# Patient Record
Sex: Male | Born: 1955 | Race: White | Hispanic: No | Marital: Married | Smoking: Former smoker
Health system: Southern US, Community
[De-identification: ages and names within clinical notes are randomized; demographics above are authoritative.]

## PROBLEM LIST (undated history)

## (undated) DIAGNOSIS — H18609 Keratoconus, unspecified, unspecified eye: Secondary | ICD-10-CM

## (undated) DIAGNOSIS — R079 Chest pain, unspecified: Secondary | ICD-10-CM

## (undated) DIAGNOSIS — K219 Gastro-esophageal reflux disease without esophagitis: Secondary | ICD-10-CM

## (undated) DIAGNOSIS — N433 Hydrocele, unspecified: Secondary | ICD-10-CM

## (undated) DIAGNOSIS — E119 Type 2 diabetes mellitus without complications: Secondary | ICD-10-CM

## (undated) DIAGNOSIS — J189 Pneumonia, unspecified organism: Secondary | ICD-10-CM

## (undated) DIAGNOSIS — R000 Tachycardia, unspecified: Secondary | ICD-10-CM

## (undated) DIAGNOSIS — I10 Essential (primary) hypertension: Secondary | ICD-10-CM

## (undated) DIAGNOSIS — G4733 Obstructive sleep apnea (adult) (pediatric): Secondary | ICD-10-CM

## (undated) DIAGNOSIS — R0789 Other chest pain: Secondary | ICD-10-CM

## (undated) DIAGNOSIS — E7800 Pure hypercholesterolemia, unspecified: Secondary | ICD-10-CM

## (undated) HISTORY — DX: Tachycardia, unspecified: R00.0

## (undated) HISTORY — DX: Essential (primary) hypertension: I10

## (undated) HISTORY — PX: KNEE ARTHROSCOPY W/ ACL RECONSTRUCTION: SHX1858

## (undated) HISTORY — DX: Other chest pain: R07.89

## (undated) HISTORY — DX: Obstructive sleep apnea (adult) (pediatric): G47.33

## (undated) HISTORY — DX: Chest pain, unspecified: R07.9

## (undated) HISTORY — PX: KNEE ARTHROSCOPY: SUR90

## (undated) HISTORY — DX: Keratoconus, unspecified, unspecified eye: H18.609

---

## 2003-01-16 ENCOUNTER — Encounter: Admission: RE | Admit: 2003-01-16 | Discharge: 2003-04-16 | Payer: Self-pay | Admitting: Family Medicine

## 2003-04-16 ENCOUNTER — Encounter: Admission: RE | Admit: 2003-04-16 | Discharge: 2003-07-15 | Payer: Self-pay | Admitting: Family Medicine

## 2011-10-12 ENCOUNTER — Other Ambulatory Visit: Payer: Self-pay | Admitting: Urology

## 2011-11-29 ENCOUNTER — Encounter (HOSPITAL_BASED_OUTPATIENT_CLINIC_OR_DEPARTMENT_OTHER): Payer: Self-pay | Admitting: *Deleted

## 2011-11-29 NOTE — Progress Notes (Signed)
NPO AFTER MN. PT ARRIVES AT 0615. NEEDS ISTAT AND EKG.

## 2011-12-02 NOTE — H&P (Signed)
History of Present Illness      [name] is a 56 year old male patient of Dr. Tenny Craw who was seen in office consultation for further evaluation of an enlarging hydrocele. The patient reports that he has had this for several years and has been seen in the past by someone but cannot recall by whom. He indicates that he was told it was a benign lesion and that at that time nothing was required. It has increased in size and now is becoming uncomfortable especially when he sits in certain positions. It's not associated with any voiding symptoms. He denies any discomfort when he standing. There are no other signs and symptoms or modifying factors. It's of mild to moderate severity.   Past Medical History Problems  1. History of  Arthritis V13.4 2. History of  Diabetes Mellitus 250.00 3. 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 History of  Knee Surgery Bilateral 2. History of  Knee Surgery 3. History of  Knee Surgery 4. History of  Knee Surgery  Current Meds 1. Aspirin 81 MG Oral Tablet; Therapy: (Recorded:18Sep2012) to 2. Benazepril HCl 20 MG Oral Tablet; Therapy: (Recorded:18Sep2012) to 3. Glimepiride 2 MG Oral Tablet; Therapy: (Recorded:18Sep2012) to 4. Kombiglyze XR 2.03-999 MG Oral Tablet Extended Release 24 Hour; Therapy:  (Recorded:18Sep2012) to  Allergies Medication  1. No Known Drug Allergies  Family History Problems  1. Family history of  Adopted 2. Family history of  Family Health Status Number Of Children 3. Family history of  FH Unobtainable - Patient Adopted  Social History Problems    Alcohol Use   Caffeine Use   Former Smoker V15.82   Marital History - Currently Married   Occupation:  Review of Systems Genitourinary, constitutional, skin, eye, otolaryngeal, hematologic/lymphatic, cardiovascular, pulmonary, endocrine, musculoskeletal, gastrointestinal, neurological and psychiatric system(s) were reviewed and pertinent findings if present are  noted.  Genitourinary: urinary frequency, nocturia, difficulty starting the urinary stream, weak urinary stream, urinary stream starts and stops and erectile dysfunction.  Constitutional: feeling tired (fatigue).  Eyes: blurred vision.    Vitals Vital Signs BMI Calculated: 33.32 BSA Calculated: 1.98 Height: 5 ft 5 in Weight: 200 lb  Blood Pressure: 144 / 82 Temperature: 98.8 F Heart Rate: 102  Physical Exam Constitutional: Well nourished and well developed . No acute distress.  ENT:. The ears and nose are normal in appearance.  Neck: The appearance of the neck is normal and no neck mass is present.  Pulmonary: No respiratory distress and normal respiratory rhythm and effort.  Cardiovascular: Heart rate and rhythm are normal . No peripheral edema.  Abdomen: The abdomen is soft and nontender. No masses are palpated. No CVA tenderness. No hernias are palpable. No hepatosplenomegaly noted.  Genitourinary: Examination of the penis demonstrates no discharge, no masses, no lesions and a normal meatus. The scrotum is without lesions. The right epididymis is palpably normal and non-tender. The left epididymis is found to have a 5-6 cm spermatocele, but non-tender. The right testis is non-tender and without masses. The left testis is non-tender and without masses.  Lymphatics: The femoral and inguinal nodes are not enlarged or tender.  Skin: Normal skin turgor, no visible rash and no visible skin lesions.  Neuro/Psych:. Mood and affect are appropriate.    Results/Data Urine COLOR: YELLOW  Reference Range YELLOW APPEARANCE: CLEAR  Reference Range CLEAR SPECIFIC GRAVITY: 1.020  Reference Range 1.005-1.030 pH: 5.5  Reference Range 5.0-8.0 GLUCOSE: NEG mg/dL Reference Range NEG BILIRUBIN: NEG  Reference Range  NEG KETONE: NEG mg/dL Reference Range NEG BLOOD: NEG  Reference Range NEG PROTEIN: NEG mg/dL Reference Range NEG UROBILINOGEN: 0.2 mg/dL Reference Range 2.1-3.0 NITRITE: NEG  Reference  Range NEG LEUKOCYTE ESTERASE: NEG  Reference Range NEG  Old records or history reviewed: notes from Dr. Charlott Rakes office as above.  The following clinical lab reports were reviewed:  His urinalysis at Dr. Charlott Rakes office was negative.    Assessment Assessed  1. Spermatocele Left 608.1   He has a cystic lesion that is located above his left testicle consistent with either a loculated hydrocele or very possibly a spermatocele. We discussed the treatment options. Observation is one option although this has been undertaken and he would like to proceed with more definitive treatment. We discussed aspiration however the likelihood of its recurrence is extremely high and therefore the definitive therapy would be surgical excision. I discussed the procedure with him in detail including its risks and complications. We also discussed his anticipated postoperative course. He understands and has elected to proceed.   Plan Health Maintenance (V70.0)  1. UA With REFLEX  Done: 18Sep2012 01:04PM Spermatocele (608.1)  2. Follow-up Schedule Surgery Office  Follow-up  Done: 18Sep2012   He is scheduled for an outpatient scrotal exploration with left spermatocelectomy.

## 2011-12-03 ENCOUNTER — Encounter (HOSPITAL_BASED_OUTPATIENT_CLINIC_OR_DEPARTMENT_OTHER): Payer: Self-pay | Admitting: *Deleted

## 2011-12-03 ENCOUNTER — Encounter (HOSPITAL_BASED_OUTPATIENT_CLINIC_OR_DEPARTMENT_OTHER): Payer: Self-pay | Admitting: Anesthesiology

## 2011-12-03 ENCOUNTER — Other Ambulatory Visit: Payer: Self-pay

## 2011-12-03 ENCOUNTER — Ambulatory Visit (HOSPITAL_BASED_OUTPATIENT_CLINIC_OR_DEPARTMENT_OTHER)
Admission: RE | Admit: 2011-12-03 | Discharge: 2011-12-03 | Disposition: A | Discharge status: Home / Self Care | Payer: Commercial Managed Care - PPO | Source: Ambulatory Visit | Attending: Urology | Admitting: Urology

## 2011-12-03 ENCOUNTER — Encounter (HOSPITAL_BASED_OUTPATIENT_CLINIC_OR_DEPARTMENT_OTHER)
Admission: RE | Disposition: A | Discharge status: Home / Self Care | Payer: Self-pay | Source: Ambulatory Visit | Attending: Urology

## 2011-12-03 ENCOUNTER — Ambulatory Visit (HOSPITAL_BASED_OUTPATIENT_CLINIC_OR_DEPARTMENT_OTHER): Payer: Commercial Managed Care - PPO | Admitting: Anesthesiology

## 2011-12-03 DIAGNOSIS — L723 Sebaceous cyst: Secondary | ICD-10-CM | POA: Insufficient documentation

## 2011-12-03 DIAGNOSIS — Z7982 Long term (current) use of aspirin: Secondary | ICD-10-CM | POA: Insufficient documentation

## 2011-12-03 DIAGNOSIS — Z79899 Other long term (current) drug therapy: Secondary | ICD-10-CM | POA: Insufficient documentation

## 2011-12-03 DIAGNOSIS — E119 Type 2 diabetes mellitus without complications: Secondary | ICD-10-CM | POA: Insufficient documentation

## 2011-12-03 DIAGNOSIS — N503 Cyst of epididymis: Secondary | ICD-10-CM

## 2011-12-03 HISTORY — PX: HYDROCELE EXCISION: SHX482

## 2011-12-03 HISTORY — DX: Hydrocele, unspecified: N43.3

## 2011-12-03 LAB — POCT I-STAT 4, (NA,K, GLUC, HGB,HCT)
Glucose, Bld: 180 mg/dL — ABNORMAL HIGH (ref 70–99)
HCT: 40 % (ref 39.0–52.0)
Hemoglobin: 13.6 g/dL (ref 13.0–17.0)
Potassium: 4.2 mEq/L (ref 3.5–5.1)
Sodium: 141 mEq/L (ref 135–145)

## 2011-12-03 LAB — GLUCOSE, CAPILLARY: Glucose-Capillary: 201 mg/dL — ABNORMAL HIGH (ref 70–99)

## 2011-12-03 SURGERY — HYDROCELECTOMY
Anesthesia: General | Site: Scrotum | Laterality: Left | Wound class: Clean Contaminated

## 2011-12-03 MED ORDER — HYDROCODONE-ACETAMINOPHEN 5-325 MG PO TABS
2.0000 | ORAL_TABLET | Freq: Four times a day (QID) | ORAL | Status: AC | PRN
  Administered 2011-12-03: 2 via ORAL

## 2011-12-03 MED ORDER — ONDANSETRON HCL 4 MG/2ML IJ SOLN
INTRAMUSCULAR | Status: DC | PRN
  Administered 2011-12-03: 4 mg via INTRAVENOUS

## 2011-12-03 MED ORDER — PROPOFOL 10 MG/ML IV EMUL
INTRAVENOUS | Status: DC | PRN
  Administered 2011-12-03: 200 mg via INTRAVENOUS

## 2011-12-03 MED ORDER — FENTANYL CITRATE 0.05 MG/ML IJ SOLN
INTRAMUSCULAR | Status: DC | PRN
  Administered 2011-12-03: 25 ug via INTRAVENOUS
  Administered 2011-12-03: 50 ug via INTRAVENOUS
  Administered 2011-12-03 (×2): 25 ug via INTRAVENOUS
  Administered 2011-12-03: 50 ug via INTRAVENOUS
  Administered 2011-12-03: 25 ug via INTRAVENOUS

## 2011-12-03 MED ORDER — LACTATED RINGERS IV SOLN
INTRAVENOUS | Status: DC
  Administered 2011-12-03: 11:00:00 via INTRAVENOUS

## 2011-12-03 MED ORDER — MEPERIDINE HCL 25 MG/ML IJ SOLN: 6.2500 mg | INTRAMUSCULAR | Status: DC | PRN

## 2011-12-03 MED ORDER — BUPIVACAINE HCL (PF) 0.25 % IJ SOLN
INTRAMUSCULAR | Status: DC | PRN
  Administered 2011-12-03: 10 mL

## 2011-12-03 MED ORDER — LACTATED RINGERS IV SOLN
INTRAVENOUS | Status: DC
  Administered 2011-12-03: 07:00:00 via INTRAVENOUS

## 2011-12-03 MED ORDER — PROMETHAZINE HCL 25 MG/ML IJ SOLN
6.2500 mg | INTRAMUSCULAR | Status: DC | PRN
  Administered 2011-12-03: 6.25 mg via INTRAVENOUS

## 2011-12-03 MED ORDER — LIDOCAINE HCL (CARDIAC) 20 MG/ML IV SOLN
INTRAVENOUS | Status: DC | PRN
  Administered 2011-12-03: 80 mg via INTRAVENOUS

## 2011-12-03 MED ORDER — FENTANYL CITRATE 0.05 MG/ML IJ SOLN
25.0000 ug | INTRAMUSCULAR | Status: DC | PRN
  Administered 2011-12-03 (×4): 25 ug via INTRAVENOUS

## 2011-12-03 MED ORDER — HYDROCODONE-ACETAMINOPHEN 10-300 MG PO TABS: 1.0000 | ORAL_TABLET | Freq: Four times a day (QID) | ORAL | Status: DC | PRN

## 2011-12-03 MED ORDER — CEFAZOLIN SODIUM 1-5 GM-% IV SOLN
1.0000 g | INTRAVENOUS | Status: AC
  Administered 2011-12-03: 2 g via INTRAVENOUS

## 2011-12-03 MED ORDER — 0.9 % SODIUM CHLORIDE (POUR BTL) OPTIME
TOPICAL | Status: DC | PRN
  Administered 2011-12-03: 500 mL

## 2011-12-03 SURGICAL SUPPLY — 44 items
BANDAGE GAUZE ELAST BULKY 4 IN (GAUZE/BANDAGES/DRESSINGS) ×2 IMPLANT
BLADE SURG 15 STRL LF DISP TIS (BLADE) ×1 IMPLANT
BLADE SURG 15 STRL SS (BLADE) ×1
BLADE SURG ROTATE 9660 (MISCELLANEOUS) ×4 IMPLANT
CANISTER SUCTION 1200CC (MISCELLANEOUS) IMPLANT
CANISTER SUCTION 2500CC (MISCELLANEOUS) ×2 IMPLANT
CLEANER CAUTERY TIP 5X5 PAD (MISCELLANEOUS) ×1 IMPLANT
CLOTH BEACON ORANGE TIMEOUT ST (SAFETY) ×2 IMPLANT
COVER MAYO STAND STRL (DRAPES) ×2 IMPLANT
COVER TABLE BACK 60X90 (DRAPES) ×2 IMPLANT
DISSECTOR ROUND CHERRY 3/8 STR (MISCELLANEOUS) ×2 IMPLANT
DRAPE PED LAPAROTOMY (DRAPES) ×2 IMPLANT
ELECT REM PT RETURN 9FT ADLT (ELECTROSURGICAL) ×2
ELECTRODE REM PT RTRN 9FT ADLT (ELECTROSURGICAL) ×1 IMPLANT
GAUZE KERLIX 2  STERILE LF (GAUZE/BANDAGES/DRESSINGS) ×2 IMPLANT
GLOVE BIO SURGEON STRL SZ8 (GLOVE) ×2 IMPLANT
GLOVE BIOGEL M 6.5 STRL (GLOVE) ×2 IMPLANT
GLOVE BIOGEL M 7.0 STRL (GLOVE) ×2 IMPLANT
GLOVE ECLIPSE 6.0 STRL STRAW (GLOVE) ×2 IMPLANT
GOWN PREVENTION PLUS XLARGE (GOWN DISPOSABLE) ×2 IMPLANT
GOWN STRL NON-REIN LRG LVL3 (GOWN DISPOSABLE) ×2 IMPLANT
GOWN W/COTTON TOWEL STD LRG (GOWNS) ×2 IMPLANT
GOWN XL W/COTTON TOWEL STD (GOWNS) ×2 IMPLANT
IV NS IRRIG 3000ML ARTHROMATIC (IV SOLUTION) IMPLANT
NEEDLE HYPO 25X1 1.5 SAFETY (NEEDLE) ×2 IMPLANT
NS IRRIG 500ML POUR BTL (IV SOLUTION) ×2 IMPLANT
PACK BASIN DAY SURGERY FS (CUSTOM PROCEDURE TRAY) ×2 IMPLANT
PAD CLEANER CAUTERY TIP 5X5 (MISCELLANEOUS) ×1
PENCIL BUTTON HOLSTER BLD 10FT (ELECTRODE) ×2 IMPLANT
SUPPORT SCROTAL LG STRP (MISCELLANEOUS) IMPLANT
SUPPORT SCROTAL LRG NO STRP (SOFTGOODS) ×2 IMPLANT
SUT CHROMIC 3 0 SH 27 (SUTURE) ×4 IMPLANT
SUT SILK 4 0 TIES 17X18 (SUTURE) IMPLANT
SUT VIC AB 4-0 RB1 27 (SUTURE)
SUT VIC AB 4-0 RB1 27X BRD (SUTURE) IMPLANT
SUT VICRYL 6 0 RB 1 (SUTURE) IMPLANT
SYR CONTROL 10ML LL (SYRINGE) ×2 IMPLANT
TAPE HYPAFIX 4 X10 (GAUZE/BANDAGES/DRESSINGS) ×2 IMPLANT
TOWEL OR 17X24 6PK STRL BLUE (TOWEL DISPOSABLE) ×2 IMPLANT
TRAY DSU PREP LF (CUSTOM PROCEDURE TRAY) ×2 IMPLANT
TUBE CONNECTING 12X1/4 (SUCTIONS) ×2 IMPLANT
WATER STERILE IRR 3000ML UROMA (IV SOLUTION) IMPLANT
WATER STERILE IRR 500ML POUR (IV SOLUTION) ×2 IMPLANT
YANKAUER SUCT BULB TIP NO VENT (SUCTIONS) ×2 IMPLANT

## 2011-12-03 NOTE — Anesthesia Procedure Notes (Signed)
Procedure Name: LMA Insertion Date/Time: 12/03/2011 7:36 AM Performed by: Lorrin Jackson Pre-anesthesia Checklist: Patient identified, Emergency Drugs available, Suction available and Patient being monitored Patient Re-evaluated:Patient Re-evaluated prior to inductionOxygen Delivery Method: Circle System Utilized Preoxygenation: Pre-oxygenation with 100% oxygen Intubation Type: IV induction Ventilation: Mask ventilation without difficulty LMA: LMA inserted LMA Size: 4.0 Number of attempts: 1 Airway Equipment and Method: bite block Placement Confirmation: positive ETCO2 Tube secured with: Tape Dental Injury: Teeth and Oropharynx as per pre-operative assessment

## 2011-12-03 NOTE — Op Note (Signed)
Pre-Operative Dx: Left hydrocele  POST-OPERATIVE DIAGNOSIS: Large left epididymal cyst  PROCEDURE:  Procedure(s): Left  epididymal cyst excision   SURGEON:  Garnett Farm  INDICATION: Brian Rollins is a 56 year old male with an enlarging left hydrocele. It is becoming uncomfortable in certain positions and we have discussed the treatment options. He has elected to proceed with surgical correction.  ANESTHESIA:  General  EBL:  Minimal  DRAINS: None  LOCAL MEDICATIONS USED:   9.5 cc of quarter percent plain Marcaine   SPECIMEN:  None  DISPOSITION OF SPECIMEN:  N/A  Description of procedure: After informed consent the patient was brought to the major OR, placed on the table and administered general anesthesia. His genitalia was then sterilely prepped and draped. An official timeout was then performed.  A midline median raphae scrotal incision was then made and carried down over what was thought to be a left hydrocele. The tissue over the  Cystic structure  was cleared using a combination of sharp and blunt technique. The cyst was noted to be separate from the testicle and associated with the epididymis. The cyst  was then opened, drained of clear amber fluid. The excess cyst wall  tissue was excised with the Bovie electrocautery and then I cauterized the edges. The appendix testis was removed with the Bovie.The testicle was noted be normal.   His testicle was then replaced in the normal anatomic position and his  left  hemiscrotum. I then closed the deep scrotal tissue with running 3-0 chromic suture in a locking fashion. I injected quarter percent Marcaine in the subcutaneous tissue and closed the skin with running 3-0 chromic. A sterile gauze dressing, fluff Kerlix and a scrotal support were applied. The patient tolerated the procedure well no intraoperative complications. Needle sponge and instrument counts were correct at the end of the operation.   PLAN OF CARE: Discharge to home after  PACU  PATIENT DISPOSITION:  PACU - hemodynamically stable.

## 2011-12-03 NOTE — Anesthesia Preprocedure Evaluation (Addendum)
Anesthesia Evaluation  Patient identified by MRN, date of birth, ID band Patient awake    Reviewed: Allergy & Precautions, H&P , NPO status , Patient's Chart, lab work & pertinent test results  Airway Mallampati: II TM Distance: >3 FB Neck ROM: full    Dental No notable dental hx.    Pulmonary neg pulmonary ROS,  clear to auscultation  Pulmonary exam normal       Cardiovascular Exercise Tolerance: Good neg cardio ROS regular Normal    Neuro/Psych Negative Neurological ROS  Negative Psych ROS   GI/Hepatic negative GI ROS, Neg liver ROS,   Endo/Other  Negative Endocrine ROSDiabetes mellitus-, Type 2, Oral Hypoglycemic Agents  Renal/GU negative Renal ROS  Genitourinary negative   Musculoskeletal   Abdominal   Peds  Hematology negative hematology ROS (+)   Anesthesia Other Findings   Reproductive/Obstetrics negative OB ROS                           Anesthesia Physical Anesthesia Plan  ASA: II  Anesthesia Plan: General   Post-op Pain Management:    Induction:   Airway Management Planned: LMA  Additional Equipment:   Intra-op Plan:   Post-operative Plan:   Informed Consent: I have reviewed the patients History and Physical, chart, labs and discussed the procedure including the risks, benefits and alternatives for the proposed anesthesia with the patient or authorized representative who has indicated his/her understanding and acceptance.   Dental Advisory Given  Plan Discussed with: CRNA  Anesthesia Plan Comments:        Anesthesia Quick Evaluation

## 2011-12-03 NOTE — Transfer of Care (Signed)
Immediate Anesthesia Transfer of Care Note  Patient: Brian Rollins  Procedure(s) Performed:  HYDROCELECTOMY ADULT  Patient Location: Patient transported to PACU with oxygen via face mask at 6 Liters / Min  Anesthesia Type: General  Level of Consciousness: awake and alert   Airway & Oxygen Therapy: Patient Spontanous Breathing and Patient connected to face mask oxygen Post-op Assessment: Report given to PACU RN and Post -op Vital signs reviewed and stable  Post vital signs: Reviewed and stable  Complications: No apparent anesthesia complications

## 2011-12-03 NOTE — Interval H&P Note (Signed)
History and Physical Interval Note:  12/03/2011 6:54 AM  Brian Rollins  has presented today for surgery, with the diagnosis of Left hydrocele  The various methods of treatment have been discussed with the patient and family. After consideration of risks, benefits and other options for treatment, the patient has consented to  Procedure(s): HYDROCELECTOMY ADULT as a surgical intervention .  The patients' history has been reviewed, patient examined, no change in status, stable for surgery.  I have reviewed the patients' chart and labs.  Questions were answered to the patient's satisfaction.     Garnett Farm

## 2011-12-03 NOTE — Progress Notes (Signed)
Dr. Acey Lav notified of blood sugar of 201. No coverage given.

## 2011-12-03 NOTE — Progress Notes (Signed)
Dr Vernie Ammons notified of no availability of hydrocodone/acetaminophen 10/300. New order to give pt vicoden 5/325 x 2 tablets in PACU. No change to script written for pt D/C

## 2011-12-03 NOTE — Anesthesia Postprocedure Evaluation (Signed)
  Anesthesia Post-op Note  Patient: Brian Rollins  Procedure(s) Performed:  HYDROCELECTOMY ADULT  Patient Location: PACU  Anesthesia Type: General  Level of Consciousness: awake and alert   Airway and Oxygen Therapy: Patient Spontanous Breathing  Post-op Pain: mild  Post-op Assessment: Post-op Vital signs reviewed, Patient's Cardiovascular Status Stable, Respiratory Function Stable, Patent Airway and No signs of Nausea or vomiting  Post-op Vital Signs: stable  Complications: No apparent anesthesia complications

## 2011-12-06 ENCOUNTER — Encounter (HOSPITAL_BASED_OUTPATIENT_CLINIC_OR_DEPARTMENT_OTHER): Payer: Self-pay | Admitting: Urology

## 2015-04-22 NOTE — Progress Notes (Signed)
Patient ID: Brian Rollins, male   DOB: 1956-04-04, 59 y.o.   MRN: [id number]     Cardiology Office Note   Date:  04/22/2015   ID:  Brian Rollins, DOB 1956-01-31, MRN [id number]  PCP:   Melinda Crutch, MD  Cardiologist:   Jenkins Rouge, MD   No chief complaint on file.     History of Present Illness: Brian Rollins is a 59 y.o. male who presents for evaluation of chest pain CRF;s HTN and DM.  Last couple of months has had palpitations with chest pressure and occasional nausea/  Usually when doing hard work no rest pain Given nitro by Dr Harrington Challenger but has not used it.  ECG with new RBBB compared to 2013.  No history of GI issues, GERD, gastroparesis or GB disease.  Food does not make symptoms worse No previous stress testing.  Dyspne with these episodes  Takes it easy and symptoms resolve.  A1c use to be over 10 over last few years has improved but still has too much bread and carbs   Has a church trip to [place] planned in June and wants to make sure it is safe to go      Past Medical History  Diagnosis Date  . Hydrocele, left   . Diabetes mellitus     oral med  . Chest pain on exertion   . Chest discomfort   . Rapid heart beat   . Hypertension   . Keratoconus   . OSA (obstructive sleep apnea)     Past Surgical History  Procedure Laterality Date  . Knee arthroscopy w/ acl reconstruction  15  YRS AGO  . Knee arthroscopy  X2  . Hydrocele excision  12/03/2011    Procedure: HYDROCELECTOMY ADULT;  Surgeon: Claybon Jabs, MD;  Location: [facility];  Service: Urology;  Laterality: Left;     Current Outpatient Prescriptions  Medication Sig Dispense Refill  . aspirin 81 MG tablet Take 160 mg by mouth daily.      . benazepril (LOTENSIN) 20 MG tablet Take 20 mg by mouth daily. Takes to protect kidneys    . empagliflozin (JARDIANCE) 25 MG TABS tablet Take 25 mg by mouth daily.    Marland Kitchen glimepiride (AMARYL) 2 MG tablet Take 2 mg by mouth daily before breakfast.      . nitroGLYCERIN  (NITROSTAT) 0.4 MG SL tablet Place 0.4 mg under the tongue every 5 (five) minutes as needed for chest pain.    . simvastatin (ZOCOR) 40 MG tablet Take 40 mg by mouth daily.    . sitaGLIPtin-metformin (JANUMET) 50-1000 MG per tablet Take 1 tablet by mouth 2 (two) times daily with a meal.    . tamsulosin (FLOMAX) 0.4 MG CAPS capsule Take 0.4 mg by mouth daily.     No current facility-administered medications for this visit.    Allergies:   Mercury and Penicillins    Social History:  The patient  reports that he has quit smoking. He has never used smokeless tobacco. He reports that he drinks alcohol. He reports that he does not use illicit drugs.   Family History:  The patient's family history is not on file. He was adopted.    ROS:  Please see the history of present illness.   Otherwise, review of systems are positive for none.   All other systems are reviewed and negative.    PHYSICAL EXAM: VS:  There were no vitals taken for this visit. , BMI There is no  weight on file to calculate BMI. Affect appropriate Healthy:  appears stated age 50: normal Neck supple with no adenopathy JVP normal no bruits no thyromegaly Lungs clear with no wheezing and good diaphragmatic motion Heart:  S1/S2 no murmur, no rub, gallop or click PMI normal Abdomen: benighn, BS positve, no tenderness, no AAA no bruit.  No HSM or HJR Distal pulses intact with no bruits No edema Neuro non-focal Skin warm and dry No muscular weakness    EKG:   2013  SR nonspecific ST/T Wave changes normal intervals  04/17/15  SR rate 97 RBBB new since 2014     Recent Labs: No results found for requested labs within last 365 days.    Lipid Panel No results found for: CHOL, TRIG, HDL, CHOLHDL, VLDL, LDLCALC, LDLDIRECT    Wt Readings from Last 3 Encounters:  11/29/11 90.719 kg (200 lb)      Other studies Reviewed: Additional studies/ records that were reviewed today include:  [facility] notes / ECG from Dr Harrington Challenger  .    ASSESSMENT AND PLAN:  1.  Chest Pain:  With palpitations and dyspnea  F/u stress echo 2. RBBB new by itself does not mean anything f/u ECG yearly no signs of severe lung issues  3. HTN:  Well controlled.  Continue current medications and low sodium Dash type diet.   4. DM  Discussed low carb diet.  Target hemoglobin A1c is 6.5 or less.  Continue current medications. 5. Palpitations consider changing to beta blocker  For BP  Will see how stress echo looks first    Current medicines are reviewed at length with the patient today.  The patient does not have concerns regarding medicines.  The following changes have been made:  no change  Labs/ tests ordered today include:  Stress echo  No orders of the defined types were placed in this encounter.     Disposition:   FU with me next available      Signed, Jenkins Rouge, MD  04/22/2015 5:57 PM    [facility] [place], [place], [place]  [address] Phone: [phone]; Fax: [phone]

## 2015-04-23 ENCOUNTER — Ambulatory Visit (INDEPENDENT_AMBULATORY_CARE_PROVIDER_SITE_OTHER): Payer: Commercial Managed Care - PPO | Admitting: Cardiovascular Disease

## 2015-04-23 ENCOUNTER — Encounter: Payer: Self-pay | Admitting: Cardiovascular Disease

## 2015-04-23 VITALS — BP 110/74 | HR 92 | Ht 66.0 in | Wt 198.2 lb

## 2015-04-23 DIAGNOSIS — R0789 Other chest pain: Secondary | ICD-10-CM | POA: Diagnosis not present

## 2015-04-23 NOTE — Patient Instructions (Signed)
Medication Instructions:  NO CHANGES  Labwork: NONE  Testing/Procedures: Your physician has requested that you have a stress echocardiogram. For further information please visit [email or web address]. Please follow instruction sheet as given.   Follow-Up: Your physician recommends that you schedule a follow-up appointment in:  [place]  Any Other Special Instructions Will Be Listed Below (If Applicable).

## 2015-04-30 ENCOUNTER — Telehealth (HOSPITAL_COMMUNITY): Payer: Self-pay | Admitting: *Deleted

## 2015-04-30 NOTE — Telephone Encounter (Signed)
Left message on voicemail in reference to upcoming appointment scheduled for 05/02/15. Phone number given for a call back so details instructions can be given. Hubbard Robinson, RN

## 2015-05-02 ENCOUNTER — Ambulatory Visit (HOSPITAL_COMMUNITY): Payer: Commercial Managed Care - PPO | Attending: Cardiovascular Disease

## 2015-05-02 ENCOUNTER — Ambulatory Visit (HOSPITAL_COMMUNITY): Payer: Commercial Managed Care - PPO

## 2015-05-02 DIAGNOSIS — E119 Type 2 diabetes mellitus without complications: Secondary | ICD-10-CM | POA: Insufficient documentation

## 2015-05-02 DIAGNOSIS — R0789 Other chest pain: Secondary | ICD-10-CM

## 2015-05-02 DIAGNOSIS — I10 Essential (primary) hypertension: Secondary | ICD-10-CM | POA: Insufficient documentation

## 2015-05-05 ENCOUNTER — Telehealth: Payer: Self-pay | Admitting: Cardiovascular Disease

## 2015-05-05 NOTE — Telephone Encounter (Signed)
New Message  Pt calling for stress test and ECHO results. Trying to go ouit of town. Will need the results//sr

## 2015-05-05 NOTE — Telephone Encounter (Signed)
PT'S WIFE  AWARE OF  STRESS ECHO  RESULTS  .Adonis Housekeeper

## 2015-05-05 NOTE — Telephone Encounter (Signed)
Follow up    Calling back regarding test results.

## 2015-07-29 ENCOUNTER — Ambulatory Visit: Payer: Commercial Managed Care - PPO | Admitting: Cardiovascular Disease

## 2015-07-30 ENCOUNTER — Other Ambulatory Visit: Payer: Self-pay | Admitting: Cardiovascular Disease

## 2015-07-30 ENCOUNTER — Ambulatory Visit (INDEPENDENT_AMBULATORY_CARE_PROVIDER_SITE_OTHER): Payer: Commercial Managed Care - PPO | Admitting: Cardiovascular Disease

## 2015-07-30 ENCOUNTER — Other Ambulatory Visit: Payer: Commercial Managed Care - PPO

## 2015-07-30 ENCOUNTER — Encounter (INDEPENDENT_AMBULATORY_CARE_PROVIDER_SITE_OTHER): Payer: Commercial Managed Care - PPO

## 2015-07-30 ENCOUNTER — Encounter: Payer: Self-pay | Admitting: Cardiovascular Disease

## 2015-07-30 VITALS — BP 124/60 | HR 103 | Ht 66.0 in | Wt 201.0 lb

## 2015-07-30 DIAGNOSIS — R000 Tachycardia, unspecified: Secondary | ICD-10-CM

## 2015-07-30 DIAGNOSIS — R0789 Other chest pain: Secondary | ICD-10-CM | POA: Diagnosis not present

## 2015-07-30 MED ORDER — METOPROLOL SUCCINATE ER 100 MG PO TB24: 100.0000 mg | ORAL_TABLET | Freq: Every day | ORAL | Status: DC

## 2015-07-30 NOTE — Progress Notes (Signed)
Patient ID: Brian Rollins, male   DOB: 11/21/1956, 59 y.o.   MRN: [id number]     Cardiology Office Note   Date:  07/30/2015   ID:  Brian Rollins, DOB 1956-03-31, MRN [id number]  PCP:   Brian Crutch, MD  Cardiologist:   Brian Rouge, MD   No chief complaint on file.     History of Present Illness: Brian Rollins is a 59 y.o. male who presents for evaluation of chest pain CRF;s HTN and DM.  Last couple of months has had palpitations with chest pressure and occasional nausea/  Usually when doing hard work no rest pain Given nitro by Dr Brian Rollins but has not used it.  ECG with new RBBB compared to 2013.  No history of GI issues, GERD, gastroparesis or GB disease.  Food does not make symptoms worse No previous stress testing.  Dyspne with these episodes  Takes it easy and symptoms resolve.  A1c use to be over 10 over last few years has improved but still has too much bread and carbs   Has a church trip to [place] planned in June and wants to make sure it is safe to go      Past Medical History  Diagnosis Date  . Hydrocele, left   . Diabetes mellitus     oral med  . Chest pain on exertion   . Chest discomfort   . Rapid heart beat   . Hypertension   . Keratoconus   . OSA (obstructive sleep apnea)     Past Surgical History  Procedure Laterality Date  . Knee arthroscopy w/ acl reconstruction  15  YRS AGO  . Knee arthroscopy  X2  . Hydrocele excision  12/03/2011    Procedure: HYDROCELECTOMY ADULT;  Surgeon: Claybon Jabs, MD;  Location: [facility];  Service: Urology;  Laterality: Left;     Current Outpatient Prescriptions  Medication Sig Dispense Refill  . aspirin 81 MG tablet Take 160 mg by mouth daily.      . benazepril (LOTENSIN) 20 MG tablet Take 20 mg by mouth daily. Takes to protect kidneys    . empagliflozin (JARDIANCE) 25 MG TABS tablet Take 25 mg by mouth daily.    Marland Kitchen glimepiride (AMARYL) 2 MG tablet Take 2 mg by mouth daily before breakfast.      . nitroGLYCERIN  (NITROSTAT) 0.4 MG SL tablet Place 0.4 mg under the tongue every 5 (five) minutes as needed for chest pain (no more than 3 doses).     . simvastatin (ZOCOR) 40 MG tablet Take 40 mg by mouth daily.    . sitaGLIPtin-metformin (JANUMET) 50-1000 MG per tablet Take 1 tablet by mouth 2 (two) times daily with a meal.    . tamsulosin (FLOMAX) 0.4 MG CAPS capsule Take 0.4 mg by mouth daily.     No current facility-administered medications for this visit.    Allergies:   Mercury and Penicillins    Social History:  The patient  reports that he has quit smoking. He has never used smokeless tobacco. He reports that he drinks alcohol. He reports that he does not use illicit drugs.   Family History:  The patient's He was adopted. Family history is unknown by patient.    ROS:  Please see the history of present illness.   Otherwise, review of systems are positive for none.   All other systems are reviewed and negative.    PHYSICAL EXAM: VS:  BP 124/60 mmHg  Pulse 103  Ht '5\' 6"'$  (1.676 m)  Wt 91.173 kg (201 lb)  BMI 32.46 kg/m2 , BMI Body mass index is 32.46 kg/(m^2). Affect appropriate Healthy:  appears stated age 37: normal Neck supple with no adenopathy JVP normal no bruits no thyromegaly Lungs clear with no wheezing and good diaphragmatic motion Heart:  S1/S2 no murmur, no rub, gallop or click PMI normal Abdomen: benighn, BS positve, no tenderness, no AAA no bruit.  No HSM or HJR Distal pulses intact with no bruits No edema Neuro non-focal Skin warm and dry No muscular weakness    EKG:   2013  SR nonspecific ST/T Wave changes normal intervals  04/17/15  SR rate 97 RBBB new since 2014  07/30/15 NSR rate 99 RBBB no arrhythmia   Recent Labs: No results found for requested labs within last 365 days.    Lipid Panel No results found for: CHOL, TRIG, HDL, CHOLHDL, VLDL, LDLCALC, LDLDIRECT    Wt Readings from Last 3 Encounters:  07/30/15 91.173 kg (201 lb)  04/23/15 89.903 kg (198  lb 3.2 oz)  11/29/11 90.719 kg (200 lb)      Other studies Reviewed: Additional studies/ records that were reviewed today include:  [facility] notes / ECG from Dr Brian Rollins .    ASSESSMENT AND PLAN:  1.  Chest Pain:  With palpitations and dyspnea  Stress echo normal  2. RBBB new by itself does not mean anything f/u ECG yearly no signs of severe lung issues  3. HTN:  Well controlled.  Continue current medications and low sodium Dash type diet.   4. DM  Discussed low carb diet.  Target hemoglobin A1c is 6.5 or less.  Continue current medications. 5. Palpitations HR stayed high post stress echo for a long time  Need to r/o atrial tachycardia  24 hr holter To document circadian variation and average HR.  Start Toprol $RemoveBefo'100mg'vUybcQWGAgb$   Check labs including TSH   Current medicines are reviewed at length with the patient today.  The patient does not have concerns regarding medicines.  The following changes have been made:  Toprol 100 mg added   Labs/ tests ordered today include:   TSH,ESR,Hct, D dimer  Holter monitor   No orders of the defined types were placed in this encounter.     Disposition:   FU with me next available     Signed, Brian Rouge, MD  07/30/2015 3:47 PM    [facility] [place], [place], [place]  [address] Phone: [phone]; Fax: [phone]

## 2015-07-30 NOTE — Addendum Note (Signed)
Addended by: Gaetano Net on: 07/30/2015 04:30 PM   Modules accepted: Orders

## 2015-07-30 NOTE — Patient Instructions (Addendum)
Medication Instructions:  Your physician has recommended you make the following change in your medication:  1.  You will start Toprol 100 mg taking 1 tablet daily.  YOU WILL START THIS AFTER THE HEART MONITOR HAS BEEN REMOVED.   Labwork: CREATINE, SED RATE, D DIMER, TSH & T4 ALL WILL BE DRAWN TODAY   Testing/Procedures: Your doctor has ordered for you to wear a 24 hour heart monitor.  This will be scheduled at the check out.  Follow-Up: Dr. Johnsie Cancel would like to see you at his next available appointment.    Any Other Special Instructions Will Be Listed Below (If Applicable).

## 2015-07-31 ENCOUNTER — Telehealth: Payer: Self-pay | Admitting: *Deleted

## 2015-07-31 LAB — SEDIMENTATION RATE: Sed Rate: 5 mm/hr (ref 0–22)

## 2015-07-31 LAB — TSH: TSH: 3.33 u[IU]/mL (ref 0.35–4.50)

## 2015-07-31 LAB — HEMOGLOBIN: Hemoglobin: 14.6 g/dL (ref 13.0–17.0)

## 2015-07-31 LAB — HEMATOCRIT: HCT: 42.9 % (ref 39.0–52.0)

## 2015-07-31 LAB — D-DIMER, QUANTITATIVE

## 2015-07-31 LAB — T4, FREE: FREE T4: 0.79 ng/dL (ref 0.60–1.60)

## 2015-07-31 NOTE — Telephone Encounter (Signed)
F/u  Pt returning Christine's phone call. Please use mobile #. Please call back and discus.

## 2015-07-31 NOTE — Telephone Encounter (Signed)
PT  NOTIFIED ./CY 

## 2015-07-31 NOTE — Telephone Encounter (Signed)
Lm for PT TO CALL BACK  SOLSTAS  CALLED  AND  D DIMER WAS NOT  DONE  DUE TO  PROCESSING ERROR  ON SOLSTAS  PART./CY

## 2015-07-31 NOTE — Telephone Encounter (Signed)
DISCUSSED WITH DR Johnsie Cancel  NO NEED FOR  PT  TO  RETURN  FOR STICK  DOES NOT  NEED TO HAVE DONE AT THIS TIME .Adonis Housekeeper

## 2015-08-06 ENCOUNTER — Telehealth: Payer: Self-pay | Admitting: *Deleted

## 2015-08-06 NOTE — Telephone Encounter (Signed)
-----   Message from Josue Hector, MD sent at 08/06/2015 10:54 AM EDT ----- Regarding: FW: Holter Monitor No sig arrhythmia sent note already to you   ----- Message -----    From: Benita Stabile    Sent: 08/05/2015   8:31 AM      To: Josue Hector, MD Subject: Holter Monitor                                 Please go to "my cupid studies assigned to me" reading worklist to review patients monitor and sign the study.

## 2015-08-06 NOTE — Telephone Encounter (Signed)
LM TO CALL BACK ./CY 

## 2015-08-07 NOTE — Telephone Encounter (Signed)
LEFT VOICE MAIL  THAT  PT  NEEDS TO START   METOPROLOL .Adonis Housekeeper

## 2015-08-07 NOTE — Telephone Encounter (Signed)
Yes

## 2015-08-07 NOTE — Telephone Encounter (Signed)
LMTCB ./CY 

## 2015-08-07 NOTE — Telephone Encounter (Signed)
WHILE GIVING  MONITOR  RESULTS PT ASKED  IF  SHOULD  START   METOPROLOL  100 MG  WILL   FORWARD   MESSAGE  TO DR Johnsie Cancel  AND  FIND OUT  .Brian Rollins

## 2015-10-07 NOTE — Progress Notes (Signed)
Patient ID: Brian Rollins, male   DOB: Jun 19, 1956, 59 y.o.   MRN: [id number]     Cardiology Office Note   Date:  10/08/2015   ID:  Brian Rollins, DOB 1956-05-13, MRN [id number]  PCP:   Melinda Crutch, MD  Cardiologist:   Jenkins Rouge, MD   No chief complaint on file.     History of Present Illness: Brian Rollins is a 59 y.o. male who presents for evaluation of chest pain CRF;s HTN and DM.  Last couple of months has had palpitations with chest pressure and occasional nausea/  Usually when doing hard work no rest pain Given nitro by Dr Harrington Challenger but has not used it.  ECG with new RBBB compared to 2013.  No history of GI issues, GERD, gastroparesis or GB disease.  Food does not make symptoms worse No previous stress testing.  Dyspne with these episodes  Takes it easy and symptoms resolve.  A1c use to be over 10 over last few years has improved but still has too much bread and carbs   Has a church trip to [place] planned in June and wants to make sure it is safe to go    05/02/15 Stress echo normal HR stayed high in recovery started on Toprol 07/30/15  Holter with no significant arrhythmia    Past Medical History  Diagnosis Date  . Hydrocele, left   . Diabetes mellitus     oral med  . Chest pain on exertion   . Chest discomfort   . Rapid heart beat   . Hypertension   . Keratoconus   . OSA (obstructive sleep apnea)     Past Surgical History  Procedure Laterality Date  . Knee arthroscopy w/ acl reconstruction  15  YRS AGO  . Knee arthroscopy  X2  . Hydrocele excision  12/03/2011    Procedure: HYDROCELECTOMY ADULT;  Surgeon: Claybon Jabs, MD;  Location: [facility];  Service: Urology;  Laterality: Left;     Current Outpatient Prescriptions  Medication Sig Dispense Refill  . aspirin 81 MG tablet Take 160 mg by mouth daily.      . benazepril (LOTENSIN) 20 MG tablet Take 20 mg by mouth daily. Takes to protect kidneys    . empagliflozin (JARDIANCE) 25 MG TABS tablet Take 25 mg  by mouth daily.    Marland Kitchen glimepiride (AMARYL) 2 MG tablet Take 2 mg by mouth daily before breakfast.      . metoprolol succinate (TOPROL XL) 100 MG 24 hr tablet Take 1 tablet (100 mg total) by mouth daily. Take with or immediately following a meal. 90 tablet 3  . nitroGLYCERIN (NITROSTAT) 0.4 MG SL tablet Place 0.4 mg under the tongue every 5 (five) minutes as needed for chest pain (no more than 3 doses).     . simvastatin (ZOCOR) 40 MG tablet Take 40 mg by mouth daily.    . sitaGLIPtin-metformin (JANUMET) 50-1000 MG per tablet Take 1 tablet by mouth 2 (two) times daily with a meal.    . tamsulosin (FLOMAX) 0.4 MG CAPS capsule Take 0.4 mg by mouth daily.     No current facility-administered medications for this visit.    Allergies:   Mercury and Penicillins    Social History:  The patient  reports that he has quit smoking. He has never used smokeless tobacco. He reports that he drinks alcohol. He reports that he does not use illicit drugs.   Family History:  The patient's He was adopted. Family  history is unknown by patient.    ROS:  Please see the history of present illness.   Otherwise, review of systems are positive for none.   All other systems are reviewed and negative.    PHYSICAL EXAM: VS:  BP 120/72 mmHg  Pulse 82  Ht 5\' 6"  (1.676 m)  Wt 92.08 kg (203 lb)  BMI 32.78 kg/m2 , BMI Body mass index is 32.78 kg/(m^2). Affect appropriate Healthy:  appears stated age 64: normal Neck supple with no adenopathy JVP normal no bruits no thyromegaly Lungs clear with no wheezing and good diaphragmatic motion Heart:  S1/S2 no murmur, no rub, gallop or click PMI normal Abdomen: benighn, BS positve, no tenderness, no AAA no bruit.  No HSM or HJR Distal pulses intact with no bruits No edema Neuro non-focal Skin warm and dry No muscular weakness    EKG:   2013  SR nonspecific ST/T Wave changes normal intervals  04/17/15  SR rate 97 RBBB new since 2014  07/30/15 NSR rate 99 RBBB no  arrhythmia   Recent Labs: 07/30/2015: Hemoglobin 14.6; TSH 3.33    Lipid Panel No results found for: CHOL, TRIG, HDL, CHOLHDL, VLDL, LDLCALC, LDLDIRECT    Wt Readings from Last 3 Encounters:  10/08/15 92.08 kg (203 lb)  07/30/15 91.173 kg (201 lb)  04/23/15 89.903 kg (198 lb 3.2 oz)      Other studies Reviewed: Additional studies/ records that were reviewed today include:  [facility] notes / ECG from Dr Harrington Challenger . Holter, Stress Echo     ASSESSMENT AND PLAN:  1.  Chest Pain:  With palpitations and dyspnea  Stress echo normal  2. RBBB new by itself does not mean anything f/u ECG yearly no signs of severe lung issues  3. HTN:  Well controlled.  Continue current medications and low sodium Dash type diet.   4. DM  Discussed low carb diet.  Target hemoglobin A1c is 6.5 or less.  Continue current medications. 5. Palpitations HR stayed high post stress echo for a long time  Holter normal  Improved with Toprol    Current medicines are reviewed at length with the patient today.  The patient does not have concerns regarding medicines.   No orders of the defined types were placed in this encounter.     Disposition:   FU with me next available     Signed, Jenkins Rouge, MD  10/08/2015 2:49 PM    [address], [place], [place]  [address] Phone: [phone]; Fax: [phone]

## 2015-10-08 ENCOUNTER — Ambulatory Visit (INDEPENDENT_AMBULATORY_CARE_PROVIDER_SITE_OTHER): Payer: Commercial Managed Care - PPO | Admitting: Cardiovascular Disease

## 2015-10-08 ENCOUNTER — Encounter: Payer: Self-pay | Admitting: Cardiovascular Disease

## 2015-10-08 VITALS — BP 120/72 | HR 82 | Ht 66.0 in | Wt 203.0 lb

## 2015-10-08 DIAGNOSIS — R0789 Other chest pain: Secondary | ICD-10-CM

## 2015-10-08 DIAGNOSIS — R000 Tachycardia, unspecified: Secondary | ICD-10-CM | POA: Diagnosis not present

## 2015-10-08 NOTE — Patient Instructions (Addendum)
Medication Instructions:  Your physician recommends that you continue on your current medications as directed. Please refer to the Current Medication list given to you today.   Labwork: NONE  Testing/Procedures: NONE  Follow-Up: Your physician wants you to follow-up in: 6 months with Dr Nishan. You will receive a reminder letter in the mail two months in advance. If you don't receive a letter, please call our office to schedule the follow-up appointment.  Any Other Special Instructions Will Be Listed Below (If Applicable).     If you need a refill on your cardiac medications before your next appointment, please call your pharmacy.   

## 2016-04-05 ENCOUNTER — Encounter: Payer: Self-pay | Admitting: Cardiovascular Disease

## 2016-04-05 ENCOUNTER — Ambulatory Visit (INDEPENDENT_AMBULATORY_CARE_PROVIDER_SITE_OTHER): Payer: Commercial Managed Care - PPO | Admitting: Cardiovascular Disease

## 2016-04-05 VITALS — BP 110/66 | HR 88 | Ht 65.0 in | Wt 198.4 lb

## 2016-04-05 DIAGNOSIS — Z09 Encounter for follow-up examination after completed treatment for conditions other than malignant neoplasm: Secondary | ICD-10-CM | POA: Diagnosis not present

## 2016-04-05 NOTE — Patient Instructions (Signed)

## 2016-04-05 NOTE — Progress Notes (Signed)
Patient ID: Brian Rollins, male   DOB: 03/27/1956, 60 y.o.   MRN: [id number]     Cardiology Office Note   Date:  04/05/2016   ID:  Brian Rollins, DOB 1956-01-12, MRN [id number]  PCP:   Brian Crutch, MD  Cardiologist:   Brian Rouge, MD   Chief Complaint  Patient presents with  . Follow-up    to Cp & tachy, no sx now      History of Present Illness: Brian Rollins is a 60 y.o. male who presents for evaluation of chest pain CRF;s HTN and DM.  Last couple of months has had palpitations with chest pressure and occasional nausea/  Usually when doing hard work no rest pain Given nitro by Dr Harrington Challenger but has not used it.  ECG with new RBBB compared to 2013.  No history of GI issues, GERD, gastroparesis or GB disease.  Food does not make symptoms worse No previous stress testing.  Dyspne with these episodes  Takes it easy and symptoms resolve.  A1c use to be over 10 over last few years has improved but still has too much bread and carbs   Has a church trip to [place] planned in June and wants to make sure it is safe to go    05/02/15 Stress echo normal HR stayed high in recovery started on Toprol 07/30/15  Holter with no significant arrhythmia    Past Medical History  Diagnosis Date  . Hydrocele, left   . Diabetes mellitus     oral med  . Chest pain on exertion   . Chest discomfort   . Rapid heart beat   . Hypertension   . Keratoconus   . OSA (obstructive sleep apnea)     Past Surgical History  Procedure Laterality Date  . Knee arthroscopy w/ acl reconstruction  15  YRS AGO  . Knee arthroscopy  X2  . Hydrocele excision  12/03/2011    Procedure: HYDROCELECTOMY ADULT;  Surgeon: Claybon Jabs, MD;  Location: [facility];  Service: Urology;  Laterality: Left;     Current Outpatient Prescriptions  Medication Sig Dispense Refill  . aspirin 81 MG tablet Take 160 mg by mouth daily.      . benazepril (LOTENSIN) 20 MG tablet Take 20 mg by mouth daily. Takes to protect kidneys    .  empagliflozin (JARDIANCE) 25 MG TABS tablet Take 25 mg by mouth daily.    Marland Kitchen glimepiride (AMARYL) 2 MG tablet Take 2 mg by mouth daily before breakfast.      . metoprolol succinate (TOPROL XL) 100 MG 24 hr tablet Take 1 tablet (100 mg total) by mouth daily. Take with or immediately following a meal. 90 tablet 3  . nitroGLYCERIN (NITROSTAT) 0.4 MG SL tablet Place 0.4 mg under the tongue every 5 (five) minutes as needed for chest pain (no more than 3 doses).     . simvastatin (ZOCOR) 40 MG tablet Take 40 mg by mouth daily.    . sitaGLIPtin-metformin (JANUMET) 50-1000 MG per tablet Take 1 tablet by mouth 2 (two) times daily with a meal.    . tamsulosin (FLOMAX) 0.4 MG CAPS capsule Take 0.4 mg by mouth daily.     No current facility-administered medications for this visit.    Allergies:   Mercury and Penicillins    Social History:  The patient  reports that he has quit smoking. He has never used smokeless tobacco. He reports that he drinks alcohol. He reports that he does  not use illicit drugs.   Family History:  The patient's He was adopted. Family history is unknown by patient.    ROS:  Please see the history of present illness.   Otherwise, review of systems are positive for none.   All other systems are reviewed and negative.    PHYSICAL EXAM: VS:  BP 110/66 mmHg  Pulse 88  Ht 5\' 5"  (1.651 m)  Wt 89.994 kg (198 lb 6.4 oz)  BMI 33.02 kg/m2  SpO2 93% , BMI Body mass index is 33.02 kg/(m^2). Affect appropriate Healthy:  appears stated age 74: normal Neck supple with no adenopathy JVP normal no bruits no thyromegaly Lungs clear with no wheezing and good diaphragmatic motion Heart:  S1/S2 no murmur, no rub, gallop or click PMI normal Abdomen: benighn, BS positve, no tenderness, no AAA no bruit.  No HSM or HJR Distal pulses intact with no bruits No edema Neuro non-focal Skin warm and dry No muscular weakness    EKG:   2013  SR nonspecific ST/T Wave changes normal intervals   04/17/15  SR rate 97 RBBB new since 2014  07/30/15 NSR rate 99 RBBB no arrhythmia   Recent Labs: 07/30/2015: Hemoglobin 14.6; TSH 3.33    Lipid Panel No results found for: CHOL, TRIG, HDL, CHOLHDL, VLDL, LDLCALC, LDLDIRECT    Wt Readings from Last 3 Encounters:  04/05/16 89.994 kg (198 lb 6.4 oz)  10/08/15 92.08 kg (203 lb)  07/30/15 91.173 kg (201 lb)      Other studies Reviewed: Additional studies/ records that were reviewed today include:  [facility] notes / ECG from Dr Harrington Challenger . Holter, Stress Echo     ASSESSMENT AND PLAN:  1.  Chest Pain:  With palpitations and dyspnea  Stress echo normal  2. RBBB new by itself does not mean anything f/u ECG yearly no signs of severe lung issues  3. HTN:  Well controlled.  Continue current medications and low sodium Dash type diet.   4. DM  Discussed low carb diet.  Target hemoglobin A1c is 6.5 or less.  Continue current medications. 5. Palpitations HR stayed high post stress echo for a long time  Holter normal  Improved with Toprol    Current medicines are reviewed at length with the patient today.  The patient does not have concerns regarding medicines.   No orders of the defined types were placed in this encounter.     Disposition:   FU with me 6 months     Signed, Brian Rouge, MD  04/05/2016 3:54 PM    [facility] [place], [place], [place]  [address] Phone: [phone]; Fax: [phone]

## 2016-07-19 ENCOUNTER — Other Ambulatory Visit: Payer: Self-pay | Admitting: Cardiovascular Disease

## 2017-03-03 DIAGNOSIS — R1310 Dysphagia, unspecified: Secondary | ICD-10-CM | POA: Diagnosis not present

## 2017-03-03 DIAGNOSIS — K219 Gastro-esophageal reflux disease without esophagitis: Secondary | ICD-10-CM | POA: Diagnosis not present

## 2017-03-24 NOTE — Progress Notes (Signed)
Patient ID: Brian Rollins, male   DOB: 01/01/1956, 61 y.o.   MRN: [id number]     Cardiology Office Note   Date:  03/30/2017   ID:  Brian Rollins, DOB Feb 29, 1956, MRN [id number]  PCP:  Melinda Crutch, MD  Cardiologist:   Jenkins Rouge, MD   Chief Complaint  Patient presents with  . Follow-up  . Tachycardia      History of Present Illness: Brian Rollins is a 61 y.o. male first seen for evaluation of chest pain CRF;s HTN and DM in 2016 . Had palpitations with chest pressure and occasional nausea/  Usually when doing hard work no rest pain  ECG with new RBBB compared to 2013.  No history of GI  issues, GERD, gastroparesis or GB disease.  Food does not make symptoms worse No previous stress testing.  Dyspne with these episodes  Takes it easy and symptoms resolve.  A1c use to be over 10 over last few years has improved but still has too much bread and carbs    05/02/15 Stress echo normal HR stayed high in recovery started on Toprol 07/30/15  Holter with no significant arrhythmia   Some job and family stress Works for [employer] and travels a lot.  Just got [place] accounts And will likely have to cover. Father in law 37 with stroke leaving rehab and will need looking after  Dr Harrington Challenger following BS now on 4 oral agents.  Eating too many carbs/bread  Past Medical History:  Diagnosis Date  . Chest discomfort   . Chest pain on exertion   . Diabetes mellitus    oral med  . Hydrocele, left   . Hypertension   . Keratoconus   . OSA (obstructive sleep apnea)   . Rapid heart beat     Past Surgical History:  Procedure Laterality Date  . HYDROCELE EXCISION  12/03/2011   Procedure: HYDROCELECTOMY ADULT;  Surgeon: Claybon Jabs, MD;  Location: [facility];  Service: Urology;  Laterality: Left;  . KNEE ARTHROSCOPY  X2  . KNEE ARTHROSCOPY W/ ACL RECONSTRUCTION  15  YRS AGO     Current Outpatient Prescriptions  Medication Sig Dispense Refill  . aspirin 81 MG tablet Take 160 mg  by mouth daily.      . benazepril (LOTENSIN) 20 MG tablet Take 20 mg by mouth daily. Takes to protect kidneys    . FARXIGA 10 MG TABS tablet Take 10 mg by mouth daily.    Marland Kitchen glimepiride (AMARYL) 2 MG tablet Take 2 mg by mouth daily before breakfast.      . metoprolol succinate (TOPROL-XL) 100 MG 24 hr tablet Take 1 tablet (100 mg total) by mouth daily. Take with or immediately following a meal. 90 tablet 3  . nitroGLYCERIN (NITROSTAT) 0.4 MG SL tablet Place 0.4 mg under the tongue every 5 (five) minutes as needed for chest pain (no more than 3 doses).     . pantoprazole (PROTONIX) 40 MG tablet Take 40 mg by mouth daily.    . simvastatin (ZOCOR) 40 MG tablet Take 40 mg by mouth daily.    . sitaGLIPtin-metformin (JANUMET) 50-1000 MG per tablet Take 1 tablet by mouth 2 (two) times daily with a meal.    . tamsulosin (FLOMAX) 0.4 MG CAPS capsule Take 0.4 mg by mouth daily.     No current facility-administered medications for this visit.     Allergies:   Mercury and Penicillins    Social History:  The  patient  reports that he has quit smoking. He has never used smokeless tobacco. He reports that he drinks alcohol. He reports that he does not use drugs.   Family History:  The patient's He was adopted. Family history is unknown by patient.    ROS:  Please see the history of present illness.   Otherwise, review of systems are positive for none.   All other systems are reviewed and negative.    PHYSICAL EXAM: VS:  BP 104/62   Pulse 78   Ht 5\' 5"  (1.651 m)   Wt 203 lb (92.1 kg)   SpO2 96%   BMI 33.78 kg/m  , BMI Body mass index is 33.78 kg/m. Affect appropriate Healthy:  appears stated age 52: normal Neck supple with no adenopathy JVP normal no bruits no thyromegaly Lungs clear with no wheezing and good diaphragmatic motion Heart:  S1/S2 no murmur, no rub, gallop or click PMI normal Abdomen: benighn, BS positve, no tenderness, no AAA no bruit.  No HSM or HJR Distal pulses intact  with no bruits No edema Neuro non-focal Skin warm and dry No muscular weakness    EKG:   2013  SR nonspecific ST/T Wave changes normal intervals  04/17/15  SR rate 97 RBBB new since 2014  07/30/15 NSR rate 99 RBBB no arrhythmia  03/30/17  SR rate 69 RBBB voltage LVH    Recent Labs: No results found for requested labs within last 8760 hours.    Lipid Panel No results found for: CHOL, TRIG, HDL, CHOLHDL, VLDL, LDLCALC, LDLDIRECT    Wt Readings from Last 3 Encounters:  03/30/17 203 lb (92.1 kg)  04/05/16 198 lb 6.4 oz (90 kg)  10/08/15 203 lb (92.1 kg)      Other studies Reviewed: Additional studies/ records that were reviewed today include:  [facility] notes / ECG from Dr Harrington Challenger . Holter, Stress Echo     ASSESSMENT AND PLAN:  1.  Chest Pain:  With palpitations and dyspnea  Stress echo normal  2. RBBB new by itself does not mean anything f/u ECG yearly no signs of severe lung issues  3. HTN:  Well controlled.  Continue current medications and low sodium Dash type diet.   4. DM  Discussed low carb diet.  Target hemoglobin A1c is 6.5 or less.  Continue current medications. 5. Palpitations HR stayed high post stress echo for a long time  Holter normal  Improved with Toprol 6. Cholesterol:  On statin labs with primary    Current medicines are reviewed at length with the patient today.  The patient does not have concerns regarding medicines.    Orders Placed This Encounter  Procedures  . EKG 12-Lead     Disposition:   FU with me in a year with stress test given DM     Signed, Jenkins Rouge, MD  03/30/2017 8:23 AM    [facility] [address], [place], [place]  [address] Phone: [phone]; Fax: [phone]

## 2017-03-30 ENCOUNTER — Ambulatory Visit (INDEPENDENT_AMBULATORY_CARE_PROVIDER_SITE_OTHER): Payer: Commercial Managed Care - PPO | Admitting: Cardiovascular Disease

## 2017-03-30 ENCOUNTER — Encounter: Payer: Self-pay | Admitting: Cardiovascular Disease

## 2017-03-30 VITALS — BP 104/62 | HR 78 | Ht 65.0 in | Wt 203.0 lb

## 2017-03-30 DIAGNOSIS — Z09 Encounter for follow-up examination after completed treatment for conditions other than malignant neoplasm: Secondary | ICD-10-CM

## 2017-03-30 DIAGNOSIS — R000 Tachycardia, unspecified: Secondary | ICD-10-CM | POA: Diagnosis not present

## 2017-03-30 MED ORDER — METOPROLOL SUCCINATE ER 100 MG PO TB24: 100.0000 mg | ORAL_TABLET | Freq: Every day | ORAL | 3 refills | Status: DC

## 2017-03-30 NOTE — Patient Instructions (Signed)

## 2017-04-18 ENCOUNTER — Encounter (HOSPITAL_BASED_OUTPATIENT_CLINIC_OR_DEPARTMENT_OTHER): Payer: Self-pay | Admitting: Emergency Medicine

## 2017-04-18 ENCOUNTER — Emergency Department (HOSPITAL_BASED_OUTPATIENT_CLINIC_OR_DEPARTMENT_OTHER): Payer: Commercial Managed Care - PPO

## 2017-04-18 ENCOUNTER — Emergency Department (HOSPITAL_COMMUNITY): Payer: Commercial Managed Care - PPO

## 2017-04-18 ENCOUNTER — Emergency Department (HOSPITAL_BASED_OUTPATIENT_CLINIC_OR_DEPARTMENT_OTHER)
Admission: EM | Admit: 2017-04-18 | Discharge: 2017-04-18 | Disposition: A | Discharge status: Home / Self Care | Payer: Commercial Managed Care - PPO | Attending: Emergency Medicine | Admitting: Emergency Medicine

## 2017-04-18 DIAGNOSIS — Z7984 Long term (current) use of oral hypoglycemic drugs: Secondary | ICD-10-CM | POA: Insufficient documentation

## 2017-04-18 DIAGNOSIS — E119 Type 2 diabetes mellitus without complications: Secondary | ICD-10-CM | POA: Diagnosis not present

## 2017-04-18 DIAGNOSIS — Z79899 Other long term (current) drug therapy: Secondary | ICD-10-CM | POA: Diagnosis not present

## 2017-04-18 DIAGNOSIS — H539 Unspecified visual disturbance: Secondary | ICD-10-CM

## 2017-04-18 DIAGNOSIS — H53122 Transient visual loss, left eye: Secondary | ICD-10-CM | POA: Diagnosis not present

## 2017-04-18 DIAGNOSIS — R51 Headache: Secondary | ICD-10-CM | POA: Insufficient documentation

## 2017-04-18 DIAGNOSIS — I10 Essential (primary) hypertension: Secondary | ICD-10-CM | POA: Diagnosis not present

## 2017-04-18 DIAGNOSIS — R42 Dizziness and giddiness: Secondary | ICD-10-CM | POA: Insufficient documentation

## 2017-04-18 DIAGNOSIS — Z87891 Personal history of nicotine dependence: Secondary | ICD-10-CM | POA: Diagnosis not present

## 2017-04-18 DIAGNOSIS — H53452 Other localized visual field defect, left eye: Secondary | ICD-10-CM | POA: Diagnosis not present

## 2017-04-18 LAB — PROTIME-INR
INR: 0.99
Prothrombin Time: 13.1 seconds (ref 11.4–15.2)

## 2017-04-18 LAB — BASIC METABOLIC PANEL
Anion gap: 7 (ref 5–15)
BUN: 18 mg/dL (ref 6–20)
CO2: 24 mmol/L (ref 22–32)
CREATININE: 1.05 mg/dL (ref 0.61–1.24)
Calcium: 8.9 mg/dL (ref 8.9–10.3)
Chloride: 107 mmol/L (ref 101–111)
GFR calc Af Amer: >60 mL/min (ref >60)
GLUCOSE: 124 mg/dL — AB (ref 65–99)
Potassium: 4 mmol/L (ref 3.5–5.1)
Sodium: 138 mmol/L (ref 135–145)

## 2017-04-18 LAB — CBC
HCT: 41.7 % (ref 39.0–52.0)
Hemoglobin: 14.5 g/dL (ref 13.0–17.0)
MCH: 31 pg (ref 26.0–34.0)
MCHC: 34.8 g/dL (ref 30.0–36.0)
MCV: 89.1 fL (ref 78.0–100.0)
Platelets: 205 10*3/uL (ref 150–400)
RBC: 4.68 MIL/uL (ref 4.22–5.81)
RDW: 13.3 % (ref 11.5–15.5)
WBC: 5.1 10*3/uL (ref 4.0–10.5)

## 2017-04-18 LAB — APTT: aPTT: 28 seconds (ref 24–36)

## 2017-04-18 LAB — TROPONIN I: Troponin I: <0.03 ng/mL (ref <0.03)

## 2017-04-18 LAB — CBG MONITORING, ED: Glucose-Capillary: 103 mg/dL — ABNORMAL HIGH (ref 65–99)

## 2017-04-18 LAB — ETHANOL

## 2017-04-18 MED ORDER — PROCHLORPERAZINE EDISYLATE 5 MG/ML IJ SOLN
10.0000 mg | Freq: Once | INTRAMUSCULAR | Status: AC
  Administered 2017-04-18: 10 mg via INTRAVENOUS
  Filled 2017-04-18: qty 2

## 2017-04-18 NOTE — ED Provider Notes (Signed)
Patient transferred from [facility] due to loss of pressure for vision in his left eye and a headache. Currently the patient is asymptomatic. Labs and imaging done at [facility] without acute findings. Patient here for an MRI to rule out stroke. If normal he can be discharged home.  2:47 PM MRI without acute findings.  Will d/c home with neuro f/u if sx continue   Blanchie Dessert, MD 04/18/17 1447

## 2017-04-18 NOTE — ED Notes (Signed)
Report called to Agricultural consultant at [facility], RN

## 2017-04-18 NOTE — ED Triage Notes (Signed)
Patient states that he is having blurred vision and visual changes to the left side. The patient reports that he lost his peripheral vision at one time. The patient reports that he feels imbalance and generalized weakness. Denies any numbness of tingling and reports that he just feel "off" - checked his sugar at home and it was 144

## 2017-04-18 NOTE — ED Provider Notes (Signed)
[place] DEPT [facility] Provider Note   CSN: [id number] Arrival date & time: 04/18/17  1002     History   Chief Complaint Chief Complaint  Patient presents with  . Dizziness    HPI Brian Rollins is a 61 y.o. male.  HPI Patient presents to the emergency room for evaluation of the acute onset of visual changes that started this morning at 0830. Patient was at work this morning. He noticed that he was having trouble seeing with his peripheral vision. He could not see his hands on the outside. He was also noticing wavy lines across the field of his vision.  This was occurring with both eyes although it seemed more on the left. He felt generalized dizziness and a mild headache at the same time. The symptoms have improved now but he doesn't feel that he is back to normal. He denies any arm or leg weakness. He denies any numbness. No facial droop. No speech disturbance. Past Medical History:  Diagnosis Date  . Chest discomfort   . Chest pain on exertion   . Diabetes mellitus    oral med  . Hydrocele, left   . Hypertension   . Keratoconus   . OSA (obstructive sleep apnea)   . Rapid heart beat     There are no active problems to display for this patient.   Past Surgical History:  Procedure Laterality Date  . HYDROCELE EXCISION  12/03/2011   Procedure: HYDROCELECTOMY ADULT;  Surgeon: Claybon Jabs, MD;  Location: [facility];  Service: Urology;  Laterality: Left;  . KNEE ARTHROSCOPY  X2  . KNEE ARTHROSCOPY W/ ACL RECONSTRUCTION  15  YRS AGO       Home Medications    Prior to Admission medications   Medication Sig Start Date End Date Taking? Authorizing Provider  aspirin 81 MG tablet Take 160 mg by mouth daily.      [provider]  benazepril (LOTENSIN) 20 MG tablet Take 20 mg by mouth daily. Takes to protect kidneys    [provider]  FARXIGA 10 MG TABS tablet Take 10 mg by mouth daily. 03/04/17   [provider]  glimepiride  (AMARYL) 2 MG tablet Take 2 mg by mouth daily before breakfast.      [provider]  metoprolol succinate (TOPROL-XL) 100 MG 24 hr tablet Take 1 tablet (100 mg total) by mouth daily. Take with or immediately following a meal. 03/30/17   Josue Hector, MD  nitroGLYCERIN (NITROSTAT) 0.4 MG SL tablet Place 0.4 mg under the tongue every 5 (five) minutes as needed for chest pain (no more than 3 doses).     [provider]  pantoprazole (PROTONIX) 40 MG tablet Take 40 mg by mouth daily. 03/29/17   [provider]  simvastatin (ZOCOR) 40 MG tablet Take 40 mg by mouth daily.    [provider]  sitaGLIPtin-metformin (JANUMET) 50-1000 MG per tablet Take 1 tablet by mouth 2 (two) times daily with a meal.    [provider]  tamsulosin (FLOMAX) 0.4 MG CAPS capsule Take 0.4 mg by mouth daily.    [provider]    Family History Family History  Problem Relation Age of Onset  . Adopted: Yes  . Family history unknown: Yes    Social History Social History  Substance Use Topics  . Smoking status: Former Research scientist (life sciences)  . Smokeless tobacco: Never Used  . Alcohol use 0.0 oz/week     Comment: RARE  Allergies   Mercury and Penicillins   Review of Systems Review of Systems  All other systems reviewed and are negative.    Physical Exam Updated Vital Signs BP (!) 141/84   Pulse 68   Temp 98.4 F (36.9 C) (Oral)   Resp 15   Ht 1.651 m (5\' 5" )   Wt 86.2 kg (190 lb)   SpO2 95%   BMI 31.62 kg/m   Physical Exam  Constitutional: He is oriented to person, place, and time. He appears well-developed and well-nourished. No distress.  HENT:  Head: Normocephalic and atraumatic.  Right Ear: External ear normal.  Left Ear: External ear normal.  Mouth/Throat: Oropharynx is clear and moist.  Eyes: Conjunctivae are normal. Right eye exhibits no discharge. Left eye exhibits no discharge. No scleral icterus.  Neck: Neck supple. No tracheal deviation  present.  Cardiovascular: Normal rate, regular rhythm and intact distal pulses.   Pulmonary/Chest: Effort normal and breath sounds normal. No stridor. No respiratory distress. He has no wheezes. He has no rales.  Abdominal: Soft. Bowel sounds are normal. He exhibits no distension. There is no tenderness. There is no rebound and no guarding.  Musculoskeletal: He exhibits no edema or tenderness.  Neurological: He is alert and oriented to person, place, and time. He has normal strength. No cranial nerve deficit (No facial droop, extraocular movements intact, tongue midline ) or sensory deficit. He exhibits normal muscle tone. He displays no seizure activity. Coordination normal.  No pronator drift bilateral upper extrem, able to hold both legs off bed for 5 seconds, sensation intact in all extremities, ? Decreased field of vision superior visual fields, no left or right sided neglect, normal finger-nose exam bilaterally, no nystagmus noted   Skin: Skin is warm and dry. No rash noted.  Psychiatric: He has a normal mood and affect.  Nursing note and vitals reviewed.    ED Treatments / Results  Labs (all labs ordered are listed, but only abnormal results are displayed) Labs Reviewed  BASIC METABOLIC PANEL - Abnormal; Notable for the following:       Result Value   Glucose, Bld 124 (*)    All other components within normal limits  CBG MONITORING, ED - Abnormal; Notable for the following:    Glucose-Capillary 103 (*)    All other components within normal limits  CBC  ETHANOL  PROTIME-INR  APTT  TROPONIN I  URINALYSIS, ROUTINE W REFLEX MICROSCOPIC  RAPID URINE DRUG SCREEN, HOSP PERFORMED    EKG  EKG Interpretation  Date/Time:  Monday Apr 18 2017 10:19:25 EDT Ventricular Rate:  65 PR Interval:    QRS Duration: 143 QT Interval:  433 QTC Calculation: 451 R Axis:   11 Text Interpretation:  Sinus rhythm Right bundle branch block , new since last tracing Confirmed by Hara Milholland  MD-J, Nikolos Billig  ([id number]) on 04/18/2017 10:24:16 AM       Radiology Ct Head Wo Contrast  Result Date: 04/18/2017 CLINICAL DATA:  Dizziness EXAM: CT HEAD WITHOUT CONTRAST TECHNIQUE: Contiguous axial images were obtained from the base of the skull through the vertex without intravenous contrast. COMPARISON:  None FINDINGS: Brain: No acute intracranial abnormality. Specifically, no hemorrhage, hydrocephalus, mass lesion, acute infarction, or significant intracranial injury. Vascular: No hyperdense vessel or unexpected calcification. Skull: No acute calvarial abnormality. Sinuses/Orbits: Visualized paranasal sinuses and mastoids clear. Orbital soft tissues unremarkable. Other: None IMPRESSION: No acute intracranial abnormality. Electronically Signed   By: Rolm Baptise M.D.   On: 04/18/2017 10:38  Procedures Procedures (including critical care time)  Medications Ordered in ED Medications  prochlorperazine (COMPAZINE) injection 10 mg (not administered)     Initial Impression / Assessment and Plan / ED Course  I have reviewed the triage vital signs and the nursing notes.  Pertinent labs & imaging results that were available during my care of the patient were reviewed by me and considered in my medical decision making (see chart for details).  Clinical Course as of Apr 19 1147  Mon Apr 18, 2017  1044 I discussed the case with Dr Tobias Alexander.  Sx could be a migraine variant.  Recommends doing an MRI if the brain, if negative, no need for further workup.  Pt will require transfer from this free standing ED to have that accomplished.  [JK]    Clinical Course User Index [JK] Dorie Rank, MD   Patient presented to the emergency room with an acute visual field disturbance. His symptoms are improving. code stroke was not activated because of the improvement of his symptoms and the very mild findings on neurologic exam. I spoke with Dr. Leonel Ramsay. Possible symptoms may be a migraine variant. He recommended getting an  MRI. Unfortunately I do not have the ability to get an MRI at [facility] today. His laboratory tests are still pending but is CBG and CBC are unremarkable.  CT scan does not show any acute changes.  11:48 AM No changes.  Labs reassuring.  Waiting for transfer. Pt and wife understand and agree with plan  Spoke with Dr. Duffy Bruce. Mr Bogan will  be transferred to [facility] for MRI evaluation.   Final Clinical Impressions(s) / ED Diagnoses   Final diagnoses:  Visual disturbance      Dorie Rank, MD 04/18/17 1149

## 2017-04-18 NOTE — ED Notes (Signed)
Pt transported to MRI 

## 2017-04-18 NOTE — ED Notes (Signed)
Called [facility] for transport to [facility] ED spoke to [employer]

## 2017-08-03 DIAGNOSIS — E119 Type 2 diabetes mellitus without complications: Secondary | ICD-10-CM | POA: Diagnosis not present

## 2017-08-03 DIAGNOSIS — Z0000 Encounter for general adult medical examination without abnormal findings: Secondary | ICD-10-CM | POA: Diagnosis not present

## 2017-08-03 DIAGNOSIS — Z136 Encounter for screening for cardiovascular disorders: Secondary | ICD-10-CM | POA: Diagnosis not present

## 2017-08-04 DIAGNOSIS — I10 Essential (primary) hypertension: Secondary | ICD-10-CM | POA: Diagnosis not present

## 2017-08-04 DIAGNOSIS — Z0000 Encounter for general adult medical examination without abnormal findings: Secondary | ICD-10-CM | POA: Diagnosis not present

## 2017-08-04 DIAGNOSIS — E1165 Type 2 diabetes mellitus with hyperglycemia: Secondary | ICD-10-CM | POA: Diagnosis not present

## 2017-08-04 DIAGNOSIS — E782 Mixed hyperlipidemia: Secondary | ICD-10-CM | POA: Diagnosis not present

## 2017-08-04 DIAGNOSIS — Z23 Encounter for immunization: Secondary | ICD-10-CM | POA: Diagnosis not present

## 2017-09-19 DIAGNOSIS — H52203 Unspecified astigmatism, bilateral: Secondary | ICD-10-CM | POA: Diagnosis not present

## 2018-01-20 DIAGNOSIS — E119 Type 2 diabetes mellitus without complications: Secondary | ICD-10-CM | POA: Diagnosis not present

## 2018-01-20 DIAGNOSIS — K219 Gastro-esophageal reflux disease without esophagitis: Secondary | ICD-10-CM | POA: Diagnosis not present

## 2018-01-20 DIAGNOSIS — M25519 Pain in unspecified shoulder: Secondary | ICD-10-CM | POA: Diagnosis not present

## 2018-01-24 DIAGNOSIS — R12 Heartburn: Secondary | ICD-10-CM | POA: Diagnosis not present

## 2018-01-24 DIAGNOSIS — Z1211 Encounter for screening for malignant neoplasm of colon: Secondary | ICD-10-CM | POA: Diagnosis not present

## 2018-02-09 DIAGNOSIS — M5412 Radiculopathy, cervical region: Secondary | ICD-10-CM | POA: Diagnosis not present

## 2018-02-09 DIAGNOSIS — M25511 Pain in right shoulder: Secondary | ICD-10-CM | POA: Diagnosis not present

## 2018-02-09 DIAGNOSIS — M542 Cervicalgia: Secondary | ICD-10-CM | POA: Diagnosis not present

## 2018-02-20 DIAGNOSIS — Z1211 Encounter for screening for malignant neoplasm of colon: Secondary | ICD-10-CM | POA: Diagnosis not present

## 2018-02-20 DIAGNOSIS — K2930 Chronic superficial gastritis without bleeding: Secondary | ICD-10-CM | POA: Diagnosis not present

## 2018-02-20 DIAGNOSIS — D126 Benign neoplasm of colon, unspecified: Secondary | ICD-10-CM | POA: Diagnosis not present

## 2018-02-20 DIAGNOSIS — R12 Heartburn: Secondary | ICD-10-CM | POA: Diagnosis not present

## 2018-02-23 DIAGNOSIS — M5412 Radiculopathy, cervical region: Secondary | ICD-10-CM | POA: Diagnosis not present

## 2018-02-28 DIAGNOSIS — M5412 Radiculopathy, cervical region: Secondary | ICD-10-CM | POA: Diagnosis not present

## 2018-03-03 DIAGNOSIS — M5412 Radiculopathy, cervical region: Secondary | ICD-10-CM | POA: Diagnosis not present

## 2018-03-09 DIAGNOSIS — M25511 Pain in right shoulder: Secondary | ICD-10-CM | POA: Diagnosis not present

## 2018-03-09 DIAGNOSIS — M542 Cervicalgia: Secondary | ICD-10-CM | POA: Diagnosis not present

## 2018-03-09 DIAGNOSIS — M5412 Radiculopathy, cervical region: Secondary | ICD-10-CM | POA: Diagnosis not present

## 2018-04-01 ENCOUNTER — Other Ambulatory Visit: Payer: Self-pay | Admitting: Cardiovascular Disease

## 2018-06-23 NOTE — H&P (View-Only) (Signed)
Cardiology Office Note   Date:  06/26/2018   ID:  Brian Rollins, DOB 01/13/1956, MRN [id number]  PCP:  Lawerance Cruel, MD  Cardiologist:  Dr. Johnsie Cancel    Chief Complaint  Patient presents with  . Hypertension  . Chest Pain      History of Present Illness: Brian Rollins is a 62 y.o. male who presents for chest pain and HTN.  Brian Rollins with hx of chest pain CRF;s HTN and DM in 2016 . Had palpitations with chest pressure and occasional nausea/  Usually when doing hard work no rest pain  ECG with new RBBB compared to 2013.  No history of GI  issues, GERD, gastroparesis or GB disease.  Food does not make symptoms worse No previous stress testing.  Dyspne with these episodes  Takes it easy and symptoms resolve.  A1c use to be over 10 over last few years has improved but still has too much bread and carbs    05/02/15 Stress echo normal HR stayed high in recovery started on Toprol 07/30/15  Holter with no significant arrhythmia   Some job and family stress Works for [employer] and travels a lot.  Last saw Dr. Johnsie Cancel 03/30/17.  Today his wife is with him for visit due to pt's chest pain.  He has been having mid sternal chest pressure with exertion.  He was doing some landscaping in yard and with moving bushes he would develop pressure.  Associated with SOB and he was outside so increased diaphoresis.  He would rest for 30 min and it would resolve.  Yesterday taking a old typewriter to car he had chest tightness but resolved with rest for 5-10 min.  Now pain occurs with walking up steps but not severe enough to stop.  With these episodes his heart does race as well.    Past Medical History:  Diagnosis Date  . Chest discomfort   . Chest pain on exertion   . Diabetes mellitus    oral med  . Hydrocele, left   . Hypertension   . Keratoconus   . OSA (obstructive sleep apnea)   . Rapid heart beat     Past Surgical History:  Procedure Laterality Date  . HYDROCELE EXCISION  12/03/2011    Procedure: HYDROCELECTOMY ADULT;  Surgeon: Claybon Jabs, MD;  Location: [facility];  Service: Urology;  Laterality: Left;  . KNEE ARTHROSCOPY  X2  . KNEE ARTHROSCOPY W/ ACL RECONSTRUCTION  15  YRS AGO     Current Outpatient Medications  Medication Sig Dispense Refill  . aspirin 81 MG tablet Take 160 mg by mouth daily.      . benazepril (LOTENSIN) 20 MG tablet Take 20 mg by mouth daily. Takes to protect kidneys    . FARXIGA 10 MG TABS tablet Take 10 mg by mouth daily.    Marland Kitchen gabapentin (NEURONTIN) 300 MG capsule Take 300 mg by mouth at bedtime.    Marland Kitchen glimepiride (AMARYL) 2 MG tablet Take 2 mg by mouth daily before breakfast.      . metoprolol succinate (TOPROL-XL) 100 MG 24 hr tablet TAKE ONE TABLET BY MOUTH DAILY. TAKE WITH OR IMMEDIATELY FOLLOWING MEAL. 30 tablet 2  . nitroGLYCERIN (NITROSTAT) 0.4 MG SL tablet Place 0.4 mg under the tongue every 5 (five) minutes as needed for chest pain (no more than 3 doses).     . pantoprazole (PROTONIX) 40 MG tablet Take 40 mg by mouth daily.    . simvastatin (  ZOCOR) 40 MG tablet Take 40 mg by mouth daily.    . sitaGLIPtin-metformin (JANUMET) 50-1000 MG per tablet Take 1 tablet by mouth 2 (two) times daily with a meal.    . tamsulosin (FLOMAX) 0.4 MG CAPS capsule Take 0.4 mg by mouth daily.     No current facility-administered medications for this visit.     Allergies:   Mercury and Penicillins    Social History:  The patient  reports that he has quit smoking. He has never used smokeless tobacco. He reports that he drinks alcohol. He reports that he does not use drugs.   Family History:  The patient's He was adopted. Family history is unknown by patient.    ROS:  General:no colds or fevers, no weight changes Skin:no rashes or ulcers HEENT:no blurred vision, no congestion CV:see HPI PUL:see HPI GI:no diarrhea constipation or melena, no indigestion GU:no hematuria, no dysuria MS:no joint pain, no claudication Neuro:no  syncope, no lightheadedness Endo:+ diabetes is better controlled now than it has been in past, no thyroid disease  Wt Readings from Last 3 Encounters:  06/26/18 192 lb 12.8 oz (87.5 kg)  04/18/17 190 lb (86.2 kg)  03/30/17 203 lb (92.1 kg)     PHYSICAL EXAM: VS:  BP 104/68   Pulse 74   Ht 5\' 6"  (1.676 m)   Wt 192 lb 12.8 oz (87.5 kg)   BMI 31.12 kg/m  , BMI Body mass index is 31.12 kg/m. General:Pleasant affect, NAD Skin:Warm and dry, brisk capillary refill HEENT:normocephalic, sclera clear, mucus membranes moist Neck:supple, no JVD, no bruits  Heart:S1S2 RRR without murmur, gallup, rub or click Lungs:clear without rales, rhonchi, or wheezes FGH:WEXH, non tender, + BS, do not palpate liver spleen or masses Ext:no lower ext edema, 2+ pedal pulses, 2+ radial pulses Neuro:alert and oriented X 3, MAE, follows commands, + facial symmetry    EKG:  EKG is ordered today. The ekg ordered today demonstrates SR with RBBB and no change since 04/19/17.     Recent Labs: No results found for requested labs within last 8760 hours.    Lipid Panel No results found for: CHOL, TRIG, HDL, CHOLHDL, VLDL, LDLCALC, LDLDIRECT     Other studies Reviewed: Additional studies/ records that were reviewed today include: SR with RBBB and no changes from previous.     ASSESSMENT AND PLAN:  1.  Crescendo angina resolves with rest but occurring with less activity.  Dr. Harrington Challenger has seen and plan will be cardiac cath tomorrow.  With Dr. Tamala Julian - at 2 pm  Continue ASA  The patient understands that risks included but are not limited to stroke (1 in 1000), death (1 in 1000), kidney failure [usually temporary] (1 in 500), bleeding (1 in 200), allergic reaction [possibly serious] (1 in 200).   2.  HTN well controlled.   3.   DM-2 treated and now improved control.   4.   HLD on statin.     Current medicines are reviewed with the patient today.  The patient Has no concerns regarding medicines.  The  following changes have been made:  See above Labs/ tests ordered today include:see above  Disposition:   FU:  see above  Signed, Cecilie Kicks, NP  06/26/2018 10:24 AM    [place], [place], [place], [place] Phone: [phone]; Fax: [phone]  Patient seen and examined>   I have seen and  examined patient  Pt is a 62 ho with history of HTN, DM    Presents with history suspicious and concerning for progressive angina. ON exam:   Neck shows no JVD   Lungs are CTA   Cardiac exam:   RRR   No S3   Ext without edema  I have discussed evaluation with pt    I would recomm L heart cath to define anatmy   Also R heart cath to define pressures.     Pt understands and agrees to proceed    Will sched this week    Take activty as tolerated until then.  Keep on same meds for now.  Dorris Carnes

## 2018-06-23 NOTE — Progress Notes (Signed)
Cardiology Office Note   Date:  06/26/2018   ID:  Brian Rollins, DOB [birth year], MRN [id number]  PCP:  Lawerance Cruel, MD  Cardiologist:  Dr. Johnsie Cancel    Chief Complaint  Patient presents with  . Hypertension  . Chest Pain      History of Present Illness: Brian Rollins is a 62 y.o. male who presents for chest pain and HTN.  Brian Rollins with hx of chest pain CRF;s HTN and DM in 2016 . Had palpitations with chest pressure and occasional nausea/  Usually when doing hard work no rest pain  ECG with new RBBB compared to 2013.  No history of GI  issues, GERD, gastroparesis or GB disease.  Food does not make symptoms worse No previous stress testing.  Dyspne with these episodes  Takes it easy and symptoms resolve.  A1c use to be over 10 over last few years has improved but still has too much bread and carbs    05/02/15 Stress echo normal HR stayed high in recovery started on Toprol 07/30/15  Holter with no significant arrhythmia   Some job and family stress Works for [employer] and travels a lot.  Last saw Dr. Johnsie Cancel 03/30/17.  Today his wife is with him for visit due to pt's chest pain.  He has been having mid sternal chest pressure with exertion.  He was doing some landscaping in yard and with moving bushes he would develop pressure.  Associated with SOB and he was outside so increased diaphoresis.  He would rest for 30 min and it would resolve.  Yesterday taking a old typewriter to car he had chest tightness but resolved with rest for 5-10 min.  Now pain occurs with walking up steps but not severe enough to stop.  With these episodes his heart does race as well.    Past Medical History:  Diagnosis Date  . Chest discomfort   . Chest pain on exertion   . Diabetes mellitus    oral med  . Hydrocele, left   . Hypertension   . Keratoconus   . OSA (obstructive sleep apnea)   . Rapid heart beat     Past Surgical History:  Procedure Laterality Date  . HYDROCELE EXCISION  12/03/2011    Procedure: HYDROCELECTOMY ADULT;  Surgeon: Claybon Jabs, MD;  Location: [facility];  Service: Urology;  Laterality: Left;  . KNEE ARTHROSCOPY  X2  . KNEE ARTHROSCOPY W/ ACL RECONSTRUCTION  15  YRS AGO     Current Outpatient Medications  Medication Sig Dispense Refill  . aspirin 81 MG tablet Take 160 mg by mouth daily.      . benazepril (LOTENSIN) 20 MG tablet Take 20 mg by mouth daily. Takes to protect kidneys    . FARXIGA 10 MG TABS tablet Take 10 mg by mouth daily.    Marland Kitchen gabapentin (NEURONTIN) 300 MG capsule Take 300 mg by mouth at bedtime.    Marland Kitchen glimepiride (AMARYL) 2 MG tablet Take 2 mg by mouth daily before breakfast.      . metoprolol succinate (TOPROL-XL) 100 MG 24 hr tablet TAKE ONE TABLET BY MOUTH DAILY. TAKE WITH OR IMMEDIATELY FOLLOWING MEAL. 30 tablet 2  . nitroGLYCERIN (NITROSTAT) 0.4 MG SL tablet Place 0.4 mg under the tongue every 5 (five) minutes as needed for chest pain (no more than 3 doses).     . pantoprazole (PROTONIX) 40 MG tablet Take 40 mg by mouth daily.    . simvastatin (  ZOCOR) 40 MG tablet Take 40 mg by mouth daily.    . sitaGLIPtin-metformin (JANUMET) 50-1000 MG per tablet Take 1 tablet by mouth 2 (two) times daily with a meal.    . tamsulosin (FLOMAX) 0.4 MG CAPS capsule Take 0.4 mg by mouth daily.     No current facility-administered medications for this visit.     Allergies:   Mercury and Penicillins    Social History:  The patient  reports that he has quit smoking. He has never used smokeless tobacco. He reports that he drinks alcohol. He reports that he does not use drugs.   Family History:  The patient's He was adopted. Family history is unknown by patient.    ROS:  General:no colds or fevers, no weight changes Skin:no rashes or ulcers HEENT:no blurred vision, no congestion CV:see HPI PUL:see HPI GI:no diarrhea constipation or melena, no indigestion GU:no hematuria, no dysuria MS:no joint pain, no claudication Neuro:no  syncope, no lightheadedness Endo:+ diabetes is better controlled now than it has been in past, no thyroid disease  Wt Readings from Last 3 Encounters:  06/26/18 192 lb 12.8 oz (87.5 kg)  04/18/17 190 lb (86.2 kg)  03/30/17 203 lb (92.1 kg)     PHYSICAL EXAM: VS:  BP 104/68   Pulse 74   Ht 5\' 6"  (1.676 m)   Wt 192 lb 12.8 oz (87.5 kg)   BMI 31.12 kg/m  , BMI Body mass index is 31.12 kg/m. General:Pleasant affect, NAD Skin:Warm and dry, brisk capillary refill HEENT:normocephalic, sclera clear, mucus membranes moist Neck:supple, no JVD, no bruits  Heart:S1S2 RRR without murmur, gallup, rub or click Lungs:clear without rales, rhonchi, or wheezes YQM:VHQI, non tender, + BS, do not palpate liver spleen or masses Ext:no lower ext edema, 2+ pedal pulses, 2+ radial pulses Neuro:alert and oriented X 3, MAE, follows commands, + facial symmetry    EKG:  EKG is ordered today. The ekg ordered today demonstrates SR with RBBB and no change since 04/19/17.     Recent Labs: No results found for requested labs within last 8760 hours.    Lipid Panel No results found for: CHOL, TRIG, HDL, CHOLHDL, VLDL, LDLCALC, LDLDIRECT     Other studies Reviewed: Additional studies/ records that were reviewed today include: SR with RBBB and no changes from previous.     ASSESSMENT AND PLAN:  1.  Crescendo angina resolves with rest but occurring with less activity.  Dr. Harrington Challenger has seen and plan will be cardiac cath tomorrow.  With Dr. Tamala Julian - at 2 pm  Continue ASA  The patient understands that risks included but are not limited to stroke (1 in 1000), death (1 in 1000), kidney failure [usually temporary] (1 in 500), bleeding (1 in 200), allergic reaction [possibly serious] (1 in 200).   2.  HTN well controlled.   3.   DM-2 treated and now improved control.   4.   HLD on statin.     Current medicines are reviewed with the patient today.  The patient Has no concerns regarding medicines.  The  following changes have been made:  See above Labs/ tests ordered today include:see above  Disposition:   FU:  see above  Signed, Cecilie Kicks, NP  06/26/2018 10:24 AM    [place], [place], [place], [place] Phone: [phone]; Fax: [phone]  Patient seen and examined>   I have seen and  examined patient  Pt is a 31 ho with history of HTN, DM    Presents with history suspicious and concerning for progressive angina. ON exam:   Neck shows no JVD   Lungs are CTA   Cardiac exam:   RRR   No S3   Ext without edema  I have discussed evaluation with pt    I would recomm L heart cath to define anatmy   Also R heart cath to define pressures.     Pt understands and agrees to proceed    Will sched this week    Take activty as tolerated until then.  Keep on same meds for now.  Dorris Carnes

## 2018-06-26 ENCOUNTER — Encounter: Payer: Self-pay | Admitting: Cardiology

## 2018-06-26 ENCOUNTER — Ambulatory Visit: Payer: Commercial Managed Care - PPO | Admitting: Internal Medicine

## 2018-06-26 VITALS — BP 104/68 | HR 74 | Ht 66.0 in | Wt 192.8 lb

## 2018-06-26 DIAGNOSIS — I10 Essential (primary) hypertension: Secondary | ICD-10-CM | POA: Diagnosis not present

## 2018-06-26 DIAGNOSIS — I200 Unstable angina: Secondary | ICD-10-CM

## 2018-06-26 DIAGNOSIS — E785 Hyperlipidemia, unspecified: Secondary | ICD-10-CM | POA: Diagnosis not present

## 2018-06-26 LAB — BASIC METABOLIC PANEL
BUN / CREAT RATIO: 18 (ref 10–24)
BUN: 20 mg/dL (ref 8–27)
CO2: 21 mmol/L (ref 20–29)
Calcium: 9.2 mg/dL (ref 8.6–10.2)
Chloride: 103 mmol/L (ref 96–106)
Creatinine, Ser: 1.14 mg/dL (ref 0.76–1.27)
GFR calc non Af Amer: 69 mL/min/{1.73_m2} (ref >59)
GFR, EST AFRICAN AMERICAN: 80 mL/min/{1.73_m2} (ref >59)
Glucose: 132 mg/dL — ABNORMAL HIGH (ref 65–99)
POTASSIUM: 4.6 mmol/L (ref 3.5–5.2)
Sodium: 139 mmol/L (ref 134–144)

## 2018-06-26 LAB — CBC
Hematocrit: 44 % (ref 37.5–51.0)
Hemoglobin: 15 g/dL (ref 13.0–17.7)
MCH: 29.5 pg (ref 26.6–33.0)
MCHC: 34.1 g/dL (ref 31.5–35.7)
MCV: 86 fL (ref 79–97)
PLATELETS: 225 10*3/uL (ref 150–450)
RBC: 5.09 x10E6/uL (ref 4.14–5.80)
RDW: 13.8 % (ref 12.3–15.4)
WBC: 6.1 10*3/uL (ref 3.4–10.8)

## 2018-06-26 LAB — LIPID PANEL
Chol/HDL Ratio: 3.7 ratio (ref 0.0–5.0)
Cholesterol, Total: 123 mg/dL (ref 100–199)
HDL: 33 mg/dL — ABNORMAL LOW (ref >39)
LDL Calculated: 46 mg/dL (ref 0–99)
Triglycerides: 221 mg/dL — ABNORMAL HIGH (ref 0–149)
VLDL Cholesterol Cal: 44 mg/dL — ABNORMAL HIGH (ref 5–40)

## 2018-06-26 NOTE — Patient Instructions (Addendum)
Medication Instructions:  1. Your physician recommends that you continue on your current medications as directed. Please refer to the Current Medication list given to you today.   Labwork: TODAY LIPIDS, BMET, CBC   Testing/Procedures: Your physician has requested that you have a cardiac catheterization. Cardiac catheterization is used to diagnose and/or treat various heart conditions. Doctors may recommend this procedure for a number of different reasons. The most common reason is to evaluate chest pain. Chest pain can be a symptom of coronary artery disease (CAD), and cardiac catheterization can show whether plaque is narrowing or blocking your heart's arteries. This procedure is also used to evaluate the valves, as well as measure the blood flow and oxygen levels in different parts of your heart. For further information please visit [email or web address]. Please follow instruction sheet, as given.    Follow-Up: 2 WEEK FOLLOW UP 07/20/18 @ 11 AM WITH Cecilie Kicks, NP POST CATH PROCEDURE  Any Other Special Instructions Will Be Listed Below (If Applicable).  If you need a refill on your cardiac medications before your next appointment, please call your pharmacy.     [place] OFF[facility] [address] [place] [address] Dept: [phone] Loc: [phone]  Brian Rollins  06/26/2018  You are scheduled for a Cardiac Catheterization on Tuesday, July 30 with Dr. Daneen Schick.  1. Please arrive at the [facility] (Main Entrance A) at [facility]: [address] [place],  [address] at 11:30 AM (This time is two hours before your procedure to ensure your preparation). Free valet parking service is available.   Special note: Every effort is made to have your procedure done on time. Please understand that emergencies sometimes delay scheduled procedures.  2. Diet: Do not eat solid foods  after midnight.  The patient may have clear liquids until 5am upon the day of the procedure.  3. Labs: You will need to have blood drawn on Monday, July 29 at [facility] at [facility]. [address]  Open: 7:30am - 5pm    Phone: [phone]. You do not need to be fasting.  4. Medication instructions in preparation for your procedure:   Contrast Allergy: NO KNOWN ALLGERY  Stop taking, Benazepril (Lotensin) on Tuesday, July 30.  Stop Taking PO Diabetes Meds Janumet (Metformin + Sitagliptin)on Monday, July 29. ALSO YOU WILL NEED TO HOLD AMARYL AND FARXIGA THE MORNING OF CATH 06/27/18  On the morning of your procedure, take your Aspirin and any morning medicines NOT listed above.  ou may use sips of water.  5. Plan for one night stay--bring personal belongings. 6. Bring a current list of your medications and current insurance cards. 7. You MUST have a responsible person to drive you home. 8. Someone MUST be with you the first 24 hours after you arrive home or your discharge will be delayed. 9. Please wear clothes that are easy to get on and off and wear slip-on shoes.  Thank you for allowing [place] to care for you!   -- [facility]

## 2018-06-27 ENCOUNTER — Ambulatory Visit (HOSPITAL_COMMUNITY)
Admission: RE | Disposition: A | Discharge status: Home / Self Care | Payer: Self-pay | Source: Ambulatory Visit | Attending: Interventional Cardiology

## 2018-06-27 ENCOUNTER — Other Ambulatory Visit: Payer: Self-pay

## 2018-06-27 ENCOUNTER — Ambulatory Visit (HOSPITAL_COMMUNITY)
Admission: RE | Admit: 2018-06-27 | Discharge: 2018-06-28 | Disposition: A | Discharge status: Home / Self Care | Payer: Commercial Managed Care - PPO | Source: Ambulatory Visit | Attending: Interventional Cardiology | Admitting: Interventional Cardiology

## 2018-06-27 ENCOUNTER — Encounter (HOSPITAL_COMMUNITY): Payer: Self-pay | Admitting: General Practice

## 2018-06-27 DIAGNOSIS — E119 Type 2 diabetes mellitus without complications: Secondary | ICD-10-CM

## 2018-06-27 DIAGNOSIS — I200 Unstable angina: Secondary | ICD-10-CM | POA: Diagnosis present

## 2018-06-27 DIAGNOSIS — Z880 Allergy status to penicillin: Secondary | ICD-10-CM | POA: Diagnosis not present

## 2018-06-27 DIAGNOSIS — N189 Chronic kidney disease, unspecified: Secondary | ICD-10-CM | POA: Diagnosis not present

## 2018-06-27 DIAGNOSIS — Z7984 Long term (current) use of oral hypoglycemic drugs: Secondary | ICD-10-CM | POA: Insufficient documentation

## 2018-06-27 DIAGNOSIS — I4510 Unspecified right bundle-branch block: Secondary | ICD-10-CM | POA: Insufficient documentation

## 2018-06-27 DIAGNOSIS — E1122 Type 2 diabetes mellitus with diabetic chronic kidney disease: Secondary | ICD-10-CM | POA: Diagnosis not present

## 2018-06-27 DIAGNOSIS — E785 Hyperlipidemia, unspecified: Secondary | ICD-10-CM

## 2018-06-27 DIAGNOSIS — Z87891 Personal history of nicotine dependence: Secondary | ICD-10-CM | POA: Insufficient documentation

## 2018-06-27 DIAGNOSIS — Z7982 Long term (current) use of aspirin: Secondary | ICD-10-CM | POA: Diagnosis not present

## 2018-06-27 DIAGNOSIS — R0609 Other forms of dyspnea: Secondary | ICD-10-CM

## 2018-06-27 DIAGNOSIS — I25118 Atherosclerotic heart disease of native coronary artery with other forms of angina pectoris: Secondary | ICD-10-CM | POA: Insufficient documentation

## 2018-06-27 DIAGNOSIS — I209 Angina pectoris, unspecified: Secondary | ICD-10-CM

## 2018-06-27 DIAGNOSIS — I25119 Atherosclerotic heart disease of native coronary artery with unspecified angina pectoris: Secondary | ICD-10-CM

## 2018-06-27 DIAGNOSIS — G4733 Obstructive sleep apnea (adult) (pediatric): Secondary | ICD-10-CM | POA: Diagnosis not present

## 2018-06-27 DIAGNOSIS — I10 Essential (primary) hypertension: Secondary | ICD-10-CM

## 2018-06-27 DIAGNOSIS — I129 Hypertensive chronic kidney disease with stage 1 through stage 4 chronic kidney disease, or unspecified chronic kidney disease: Secondary | ICD-10-CM | POA: Diagnosis not present

## 2018-06-27 DIAGNOSIS — Z955 Presence of coronary angioplasty implant and graft: Secondary | ICD-10-CM

## 2018-06-27 HISTORY — PX: CORONARY STENT INTERVENTION: CATH118234

## 2018-06-27 HISTORY — DX: Pure hypercholesterolemia, unspecified: E78.00

## 2018-06-27 HISTORY — PX: RIGHT/LEFT HEART CATH AND CORONARY ANGIOGRAPHY: CATH118266

## 2018-06-27 HISTORY — DX: Gastro-esophageal reflux disease without esophagitis: K21.9

## 2018-06-27 HISTORY — DX: Pneumonia, unspecified organism: J18.9

## 2018-06-27 HISTORY — PX: CORONARY ANGIOPLASTY WITH STENT PLACEMENT: SHX49

## 2018-06-27 HISTORY — DX: Type 2 diabetes mellitus without complications: E11.9

## 2018-06-27 LAB — POCT I-STAT 3, ART BLOOD GAS (G3+)
Acid-base deficit: 3 mmol/L — ABNORMAL HIGH (ref 0.0–2.0)
Bicarbonate: 23.4 mmol/L (ref 20.0–28.0)
O2 SAT: 90 %
PCO2 ART: 46.1 mmHg (ref 32.0–48.0)
PH ART: 7.313 — AB (ref 7.350–7.450)
PO2 ART: 64 mmHg — AB (ref 83.0–108.0)
TCO2: 25 mmol/L (ref 22–32)

## 2018-06-27 LAB — CREATININE, SERUM
Creatinine, Ser: 1.07 mg/dL (ref 0.61–1.24)
GFR calc non Af Amer: >60 mL/min (ref >60)

## 2018-06-27 LAB — POCT ACTIVATED CLOTTING TIME
ACTIVATED CLOTTING TIME: 279 s
ACTIVATED CLOTTING TIME: 290 s
ACTIVATED CLOTTING TIME: 241 s
ACTIVATED CLOTTING TIME: 384 s

## 2018-06-27 LAB — GLUCOSE, CAPILLARY
GLUCOSE-CAPILLARY: 144 mg/dL — AB (ref 70–99)
GLUCOSE-CAPILLARY: 144 mg/dL — AB (ref 70–99)
Glucose-Capillary: 160 mg/dL — ABNORMAL HIGH (ref 70–99)

## 2018-06-27 LAB — POCT I-STAT 3, VENOUS BLOOD GAS (G3P V)
Acid-base deficit: 2 mmol/L (ref 0.0–2.0)
BICARBONATE: 24.3 mmol/L (ref 20.0–28.0)
O2 Saturation: 65 %
PH VEN: 7.302 (ref 7.250–7.430)
TCO2: 26 mmol/L (ref 22–32)
pCO2, Ven: 49.3 mmHg (ref 44.0–60.0)
pO2, Ven: 38 mmHg (ref 32.0–45.0)

## 2018-06-27 LAB — CBC
HCT: 41.1 % (ref 39.0–52.0)
HEMOGLOBIN: 13.7 g/dL (ref 13.0–17.0)
MCH: 29.5 pg (ref 26.0–34.0)
MCHC: 33.3 g/dL (ref 30.0–36.0)
MCV: 88.4 fL (ref 78.0–100.0)
Platelets: 207 10*3/uL (ref 150–400)
RBC: 4.65 MIL/uL (ref 4.22–5.81)
RDW: 13.3 % (ref 11.5–15.5)
WBC: 9 10*3/uL (ref 4.0–10.5)

## 2018-06-27 SURGERY — RIGHT/LEFT HEART CATH AND CORONARY ANGIOGRAPHY
Anesthesia: LOCAL

## 2018-06-27 MED ORDER — SODIUM CHLORIDE 0.9 % WEIGHT BASED INFUSION: 1.0000 mL/kg/h | INTRAVENOUS | Status: DC

## 2018-06-27 MED ORDER — IOPAMIDOL (ISOVUE-370) INJECTION 76%
INTRAVENOUS | Status: AC
  Filled 2018-06-27: qty 100

## 2018-06-27 MED ORDER — HEPARIN SODIUM (PORCINE) 1000 UNIT/ML IJ SOLN
INTRAMUSCULAR | Status: DC | PRN
  Administered 2018-06-27: 2000 [IU] via INTRAVENOUS
  Administered 2018-06-27: 4000 [IU] via INTRAVENOUS
  Administered 2018-06-27: 2000 [IU] via INTRAVENOUS
  Administered 2018-06-27: 8000 [IU] via INTRAVENOUS
  Administered 2018-06-27: 2000 [IU] via INTRAVENOUS

## 2018-06-27 MED ORDER — NITROGLYCERIN IN D5W 200-5 MCG/ML-% IV SOLN
10.0000 ug/min | INTRAVENOUS | Status: DC
  Administered 2018-06-27: 10 ug/min via INTRAVENOUS

## 2018-06-27 MED ORDER — SODIUM CHLORIDE 0.9% FLUSH: 3.0000 mL | INTRAVENOUS | Status: DC | PRN

## 2018-06-27 MED ORDER — SODIUM CHLORIDE 0.9% FLUSH: 3.0000 mL | Freq: Two times a day (BID) | INTRAVENOUS | Status: DC

## 2018-06-27 MED ORDER — IOPAMIDOL (ISOVUE-370) INJECTION 76%
INTRAVENOUS | Status: DC | PRN
  Administered 2018-06-27: 275 mL via INTRAVENOUS

## 2018-06-27 MED ORDER — METOPROLOL SUCCINATE ER 25 MG PO TB24
100.0000 mg | ORAL_TABLET | Freq: Every day | ORAL | Status: DC
  Administered 2018-06-28: 100 mg via ORAL
  Filled 2018-06-27 (×2): qty 4

## 2018-06-27 MED ORDER — BENAZEPRIL HCL 20 MG PO TABS
20.0000 mg | ORAL_TABLET | Freq: Every day | ORAL | Status: DC
  Administered 2018-06-27: 18:00:00 20 mg via ORAL
  Filled 2018-06-27 (×2): qty 1

## 2018-06-27 MED ORDER — HEPARIN (PORCINE) IN NACL 1000-0.9 UT/500ML-% IV SOLN
INTRAVENOUS | Status: AC
  Filled 2018-06-27: qty 500

## 2018-06-27 MED ORDER — FENTANYL CITRATE (PF) 100 MCG/2ML IJ SOLN
INTRAMUSCULAR | Status: AC
  Filled 2018-06-27: qty 2

## 2018-06-27 MED ORDER — LABETALOL HCL 5 MG/ML IV SOLN: 10.0000 mg | INTRAVENOUS | Status: AC | PRN

## 2018-06-27 MED ORDER — LINAGLIPTIN 5 MG PO TABS
5.0000 mg | ORAL_TABLET | Freq: Every day | ORAL | Status: DC
  Administered 2018-06-28: 5 mg via ORAL
  Filled 2018-06-27: qty 1

## 2018-06-27 MED ORDER — VERAPAMIL HCL 2.5 MG/ML IV SOLN
INTRAVENOUS | Status: AC
  Filled 2018-06-27: qty 2

## 2018-06-27 MED ORDER — TICAGRELOR 90 MG PO TABS
90.0000 mg | ORAL_TABLET | Freq: Two times a day (BID) | ORAL | Status: DC
  Administered 2018-06-28 (×2): 90 mg via ORAL
  Filled 2018-06-27 (×2): qty 1

## 2018-06-27 MED ORDER — NITROGLYCERIN 0.4 MG SL SUBL: 0.4000 mg | SUBLINGUAL_TABLET | SUBLINGUAL | Status: DC | PRN

## 2018-06-27 MED ORDER — MIDAZOLAM HCL 2 MG/2ML IJ SOLN
INTRAMUSCULAR | Status: DC | PRN
  Administered 2018-06-27 (×2): 1 mg via INTRAVENOUS

## 2018-06-27 MED ORDER — GABAPENTIN 300 MG PO CAPS
300.0000 mg | ORAL_CAPSULE | Freq: Every day | ORAL | Status: DC
Start: 2018-06-27 — End: 2018-06-28
  Administered 2018-06-27: 22:00:00 300 mg via ORAL
  Filled 2018-06-27: qty 1

## 2018-06-27 MED ORDER — SODIUM CHLORIDE 0.9 % WEIGHT BASED INFUSION
1.0000 mL/kg/h | INTRAVENOUS | Status: AC
  Administered 2018-06-27: 17:00:00 1 mL/kg/h via INTRAVENOUS

## 2018-06-27 MED ORDER — SODIUM CHLORIDE 0.9 % WEIGHT BASED INFUSION
3.0000 mL/kg/h | INTRAVENOUS | Status: DC
  Administered 2018-06-27: 3 mL/kg/h via INTRAVENOUS

## 2018-06-27 MED ORDER — MIDAZOLAM HCL 2 MG/2ML IJ SOLN
INTRAMUSCULAR | Status: AC
  Filled 2018-06-27: qty 2

## 2018-06-27 MED ORDER — HEPARIN SODIUM (PORCINE) 1000 UNIT/ML IJ SOLN
INTRAMUSCULAR | Status: AC
  Filled 2018-06-27: qty 1

## 2018-06-27 MED ORDER — ASPIRIN 81 MG PO CHEW
81.0000 mg | CHEWABLE_TABLET | Freq: Every day | ORAL | Status: DC
  Administered 2018-06-28: 81 mg via ORAL
  Filled 2018-06-27: qty 1

## 2018-06-27 MED ORDER — HYDRALAZINE HCL 20 MG/ML IJ SOLN: 5.0000 mg | INTRAMUSCULAR | Status: AC | PRN

## 2018-06-27 MED ORDER — CANAGLIFLOZIN 100 MG PO TABS
100.0000 mg | ORAL_TABLET | Freq: Every day | ORAL | Status: DC
  Administered 2018-06-28: 08:00:00 100 mg via ORAL
  Filled 2018-06-27: qty 1

## 2018-06-27 MED ORDER — TAMSULOSIN HCL 0.4 MG PO CAPS
0.8000 mg | ORAL_CAPSULE | Freq: Every day | ORAL | Status: DC
  Administered 2018-06-27: 0.8 mg via ORAL
  Filled 2018-06-27: qty 2

## 2018-06-27 MED ORDER — HEPARIN SODIUM (PORCINE) 5000 UNIT/ML IJ SOLN
5000.0000 [IU] | Freq: Three times a day (TID) | INTRAMUSCULAR | Status: DC
  Filled 2018-06-27: qty 1

## 2018-06-27 MED ORDER — SODIUM CHLORIDE 0.9 % IV SOLN: 250.0000 mL | INTRAVENOUS | Status: DC | PRN

## 2018-06-27 MED ORDER — ATORVASTATIN CALCIUM 80 MG PO TABS
80.0000 mg | ORAL_TABLET | Freq: Every day | ORAL | Status: DC
  Administered 2018-06-27: 80 mg via ORAL
  Filled 2018-06-27: qty 1

## 2018-06-27 MED ORDER — NITROGLYCERIN IN D5W 200-5 MCG/ML-% IV SOLN
INTRAVENOUS | Status: AC | PRN
  Administered 2018-06-27: 10 ug/min via INTRAVENOUS

## 2018-06-27 MED ORDER — ASPIRIN 81 MG PO CHEW: 81.0000 mg | CHEWABLE_TABLET | ORAL | Status: DC

## 2018-06-27 MED ORDER — SITAGLIP PHOS-METFORMIN HCL ER 50-1000 MG PO TB24
1.0000 | ORAL_TABLET | Freq: Two times a day (BID) | ORAL | Status: DC
  Filled 2018-06-27 (×2): qty 1

## 2018-06-27 MED ORDER — ACETAMINOPHEN 325 MG PO TABS: 650.0000 mg | ORAL_TABLET | ORAL | Status: DC | PRN

## 2018-06-27 MED ORDER — ANGIOPLASTY BOOK
Freq: Once | Status: AC
  Administered 2018-06-28: 03:00:00
  Filled 2018-06-27: qty 1

## 2018-06-27 MED ORDER — SODIUM CHLORIDE 0.9 % IV SOLN
INTRAVENOUS | Status: AC | PRN
  Administered 2018-06-27: 10 mL/h via INTRAVENOUS

## 2018-06-27 MED ORDER — HEPARIN (PORCINE) IN NACL 1000-0.9 UT/500ML-% IV SOLN
INTRAVENOUS | Status: DC | PRN
  Administered 2018-06-27 (×3): 500 mL

## 2018-06-27 MED ORDER — TICAGRELOR 90 MG PO TABS
ORAL_TABLET | ORAL | Status: AC
  Filled 2018-06-27: qty 2

## 2018-06-27 MED ORDER — GLIMEPIRIDE 2 MG PO TABS
2.0000 mg | ORAL_TABLET | Freq: Every day | ORAL | Status: DC
  Administered 2018-06-28: 08:00:00 2 mg via ORAL
  Filled 2018-06-27: qty 1

## 2018-06-27 MED ORDER — TICAGRELOR 90 MG PO TABS
ORAL_TABLET | ORAL | Status: DC | PRN
  Administered 2018-06-27: 180 mg via ORAL

## 2018-06-27 MED ORDER — LIDOCAINE HCL (PF) 1 % IJ SOLN
INTRAMUSCULAR | Status: AC
  Filled 2018-06-27: qty 30

## 2018-06-27 MED ORDER — NITROGLYCERIN 1 MG/10 ML FOR IR/CATH LAB
INTRA_ARTERIAL | Status: DC | PRN
  Administered 2018-06-27: 200 ug via INTRACORONARY

## 2018-06-27 MED ORDER — LIDOCAINE HCL (PF) 1 % IJ SOLN
INTRAMUSCULAR | Status: DC | PRN
  Administered 2018-06-27 (×2): 2 mL

## 2018-06-27 MED ORDER — NITROGLYCERIN IN D5W 200-5 MCG/ML-% IV SOLN
INTRAVENOUS | Status: AC
  Filled 2018-06-27: qty 250

## 2018-06-27 MED ORDER — VERAPAMIL HCL 2.5 MG/ML IV SOLN
INTRAVENOUS | Status: DC | PRN
  Administered 2018-06-27: 14:00:00 via INTRA_ARTERIAL

## 2018-06-27 MED ORDER — FENTANYL CITRATE (PF) 100 MCG/2ML IJ SOLN
INTRAMUSCULAR | Status: DC | PRN
  Administered 2018-06-27: 50 ug via INTRAVENOUS
  Administered 2018-06-27: 25 ug via INTRAVENOUS
  Administered 2018-06-27: 50 ug via INTRAVENOUS

## 2018-06-27 MED ORDER — NITROGLYCERIN 1 MG/10 ML FOR IR/CATH LAB
INTRA_ARTERIAL | Status: AC
  Filled 2018-06-27: qty 10

## 2018-06-27 MED ORDER — ONDANSETRON HCL 4 MG/2ML IJ SOLN
4.0000 mg | Freq: Four times a day (QID) | INTRAMUSCULAR | Status: DC | PRN
  Administered 2018-06-27: 17:00:00 4 mg via INTRAVENOUS
  Filled 2018-06-27: qty 2

## 2018-06-27 MED ORDER — METFORMIN HCL 500 MG PO TABS: 1000.0000 mg | ORAL_TABLET | Freq: Two times a day (BID) | ORAL | Status: DC

## 2018-06-27 MED ORDER — OXYCODONE HCL 5 MG PO TABS: 5.0000 mg | ORAL_TABLET | ORAL | Status: DC | PRN

## 2018-06-27 SURGICAL SUPPLY — 28 items
BALLN SAPPHIRE 2.0X12 (BALLOONS) ×2
BALLN SAPPHIRE 2.5X12 (BALLOONS) ×4
BALLN SAPPHIRE ~~LOC~~ 4.0X12 (BALLOONS) ×4 IMPLANT
BALLOON SAPPHIRE 2.0X12 (BALLOONS) ×1 IMPLANT
BALLOON SAPPHIRE 2.5X12 (BALLOONS) ×2 IMPLANT
CATH BALLN WEDGE 5F 110CM (CATHETERS) ×2 IMPLANT
CATH INFINITI 5 FR JL3.5 (CATHETERS) ×2 IMPLANT
CATH INFINITI JR4 5F (CATHETERS) ×2 IMPLANT
CATH VISTA GUIDE 6FR XBLAD3.5 (CATHETERS) ×2 IMPLANT
CATH VISTA GUIDE 6FR XBLAD4 (CATHETERS) ×2 IMPLANT
DEVICE RAD COMP TR BAND LRG (VASCULAR PRODUCTS) ×2 IMPLANT
ELECT DEFIB PAD ADLT CADENCE (PAD) ×2 IMPLANT
GLIDESHEATH SLEND A-KIT 6F 22G (SHEATH) ×2 IMPLANT
GUIDEWIRE INQWIRE 1.5J.035X260 (WIRE) ×1 IMPLANT
INQWIRE 1.5J .035X260CM (WIRE) ×2
KIT ENCORE 26 ADVANTAGE (KITS) ×4 IMPLANT
KIT ESSENTIALS PG (KITS) ×2 IMPLANT
KIT HEART LEFT (KITS) ×2 IMPLANT
PACK CARDIAC CATHETERIZATION (CUSTOM PROCEDURE TRAY) ×2 IMPLANT
SHEATH GLIDE SLENDER 4/5FR (SHEATH) ×2 IMPLANT
SHEATH PROBE COVER 6X72 (BAG) IMPLANT
STENT SYNERGY DES 3.5X20 (Permanent Stent) ×2 IMPLANT
TRANSDUCER W/STOPCOCK (MISCELLANEOUS) ×2 IMPLANT
TUBING CIL FLEX 10 FLL-RA (TUBING) ×2 IMPLANT
WIRE ASAHI PROWATER 180CM (WIRE) ×2 IMPLANT
WIRE COUGAR XT STRL 190CM (WIRE) ×6 IMPLANT
WIRE EMERALD 3MM-J .025X260CM (WIRE) ×2 IMPLANT
WIRE HI TORQ WHISPER MS 190CM (WIRE) ×4 IMPLANT

## 2018-06-27 NOTE — Research (Signed)
CADFEM Informed Consent   Subject Name: Brian Rollins  Subject met inclusion and exclusion criteria.  The informed consent form, study requirements and expectations were reviewed with the subject and questions and concerns were addressed prior to the signing of the consent form.  The subject verbalized understanding of the trail requirements.  The subject agreed to participate in the CADFEM trial and signed the informed consent.  The informed consent was obtained prior to performance of any protocol-specific procedures for the subject.  A copy of the signed informed consent was given to the subject and a copy was placed in the subject's medical record.  Neva Seat 06/27/2018, 12:39 PM

## 2018-06-27 NOTE — Interval H&P Note (Signed)
Cath Lab Visit (complete for each Cath Lab visit)  Clinical Evaluation Leading to the Procedure:   ACS: No.  Non-ACS:    Anginal Classification: CCS III  Anti-ischemic medical therapy: Minimal Therapy (1 class of medications)  Non-Invasive Test Results: No non-invasive testing performed  Prior CABG: No previous CABG      History and Physical Interval Note:  06/27/2018 1:06 PM  Brian Rollins  has presented today for surgery, with the diagnosis of angina  The various methods of treatment have been discussed with the patient and family. After consideration of risks, benefits and other options for treatment, the patient has consented to  Procedure(s): RIGHT/LEFT HEART CATH AND CORONARY ANGIOGRAPHY (N/A) as a surgical intervention .  The patient's history has been reviewed, patient examined, no change in status, stable for surgery.  I have reviewed the patient's chart and labs.  Questions were answered to the patient's satisfaction.     Brian Rollins III

## 2018-06-27 NOTE — Progress Notes (Signed)
Site area: right groin  Site Prior to Removal:  Level 0  Pressure Applied For 10 MINUTES    Minutes Beginning at 1845  Manual:   Yes.    Patient Status During Pull:  stable  Post Pull Right Brachial Site:  Level 0  Post Pull Instructions Given:  Yes.    Post Pull Pulses Present:  Yes.    Dressing Applied:  Yes.    Comments:  Veinous sheath in right brachial vein.

## 2018-06-28 ENCOUNTER — Telehealth: Payer: Self-pay | Admitting: Cardiology

## 2018-06-28 ENCOUNTER — Encounter (HOSPITAL_COMMUNITY): Payer: Self-pay | Admitting: Interventional Cardiology

## 2018-06-28 DIAGNOSIS — Z955 Presence of coronary angioplasty implant and graft: Secondary | ICD-10-CM

## 2018-06-28 DIAGNOSIS — I10 Essential (primary) hypertension: Secondary | ICD-10-CM | POA: Diagnosis not present

## 2018-06-28 DIAGNOSIS — E785 Hyperlipidemia, unspecified: Secondary | ICD-10-CM | POA: Diagnosis not present

## 2018-06-28 DIAGNOSIS — I209 Angina pectoris, unspecified: Secondary | ICD-10-CM | POA: Diagnosis not present

## 2018-06-28 DIAGNOSIS — I129 Hypertensive chronic kidney disease with stage 1 through stage 4 chronic kidney disease, or unspecified chronic kidney disease: Secondary | ICD-10-CM | POA: Diagnosis not present

## 2018-06-28 DIAGNOSIS — E1122 Type 2 diabetes mellitus with diabetic chronic kidney disease: Secondary | ICD-10-CM | POA: Diagnosis not present

## 2018-06-28 DIAGNOSIS — N189 Chronic kidney disease, unspecified: Secondary | ICD-10-CM | POA: Diagnosis not present

## 2018-06-28 DIAGNOSIS — I25118 Atherosclerotic heart disease of native coronary artery with other forms of angina pectoris: Secondary | ICD-10-CM | POA: Diagnosis not present

## 2018-06-28 LAB — BASIC METABOLIC PANEL
Anion gap: 8 (ref 5–15)
BUN: 16 mg/dL (ref 8–23)
CHLORIDE: 107 mmol/L (ref 98–111)
CO2: 24 mmol/L (ref 22–32)
Calcium: 8.7 mg/dL — ABNORMAL LOW (ref 8.9–10.3)
Creatinine, Ser: 0.99 mg/dL (ref 0.61–1.24)
GFR calc Af Amer: >60 mL/min (ref >60)
GFR calc non Af Amer: >60 mL/min (ref >60)
Glucose, Bld: 180 mg/dL — ABNORMAL HIGH (ref 70–99)
Potassium: 3.9 mmol/L (ref 3.5–5.1)
SODIUM: 139 mmol/L (ref 135–145)

## 2018-06-28 LAB — CBC
HCT: 40.9 % (ref 39.0–52.0)
Hemoglobin: 13.4 g/dL (ref 13.0–17.0)
MCH: 29.4 pg (ref 26.0–34.0)
MCHC: 32.8 g/dL (ref 30.0–36.0)
MCV: 89.7 fL (ref 78.0–100.0)
PLATELETS: 210 10*3/uL (ref 150–400)
RBC: 4.56 MIL/uL (ref 4.22–5.81)
RDW: 13.4 % (ref 11.5–15.5)
WBC: 8.2 10*3/uL (ref 4.0–10.5)

## 2018-06-28 LAB — GLUCOSE, CAPILLARY: Glucose-Capillary: 139 mg/dL — ABNORMAL HIGH (ref 70–99)

## 2018-06-28 MED ORDER — TICAGRELOR 90 MG PO TABS: 90.0000 mg | ORAL_TABLET | Freq: Two times a day (BID) | ORAL | 3 refills | Status: DC

## 2018-06-28 MED ORDER — TICAGRELOR 90 MG PO TABS: 90.0000 mg | ORAL_TABLET | Freq: Two times a day (BID) | ORAL | 0 refills | Status: DC

## 2018-06-28 MED FILL — Heparin Sodium (Porcine) Inj 1000 Unit/ML: INTRAMUSCULAR | Qty: 10 | Status: AC

## 2018-06-28 NOTE — Discharge Summary (Signed)
Discharge Summary    Patient ID: Brian Rollins,  MRN: [id number], DOB/AGE: October 10, 1956 62 y.o.  Admit date: 06/27/2018 Discharge date: 06/28/2018  Primary Care Provider: Lawerance Cruel Primary Cardiologist: Jenkins Rouge, MD  Discharge Diagnoses    Principal Problem:   Angina pectoris [facility]) Active Problems:   DOE (dyspnea on exertion)   DM (diabetes mellitus), type 2 ([place])   Progressive angina (HCC)   HTN (hypertension)   HLD (hyperlipidemia)   Allergies Allergies  Allergen Reactions  . Mercury Other (See Comments)    Unknown, per pt    Diagnostic Studies/Procedures    Right/Left heart catheterization 06/27/18:  A stent was successfully placed.    Single-vessel disease with complex [place] 1, 1, 1 mid LAD bifurcation 99% stenosis with the large second diagonal 50%.  TIMI grade II flow was noted in the LAD.  The LAD is otherwise normal.  Normal left main  Normal circumflex coronary artery, which is dominant.  Nondominant right coronary.  Successful complex bifurcation PCI reducing 99% LAD stenosis to less than 10% using a 3.5 x 20 mm Onyx postdilated to 4.0 mm at high pressure improving TIMI grade II flow to TIMI grade III.  Post stent deployment the diagonal contains 95% ostial stenosis and was reduced to 70% using kissing balloon technique.  Diagonal flow remained TIMI grade III.  Recommendations:   Aspirin and Brilinta x6 months.  Thereafter would recommend aspirin and clopidogrel for an additional 6 months before dropping clopidogrel.  Aggressive risk factor modification  Recommend uninterrupted dual antiplatelet therapy with Aspirin 81mg  daily and Ticagrelor 90mg  twice daily for a minimum of 12 months (ACS - Class I recommendation). _____________   History of Present Illness     Brian Rollins is a 62 y.o. male with PMH of HTN, HLD, DM type 2, OSA who presented outpatient with complaints of chest pressure and palpitations. He noted worsening of  symptoms with exertion and improvement with rest. No correlation of symptoms to food intake. He had never had an ischemic work up and was recommended for a stress echo which was normal with the exception of increased BP/HR during the procedure, for which he was started on metoprolol. He was seen in follow up by Cecilie Kicks, PA-C 06/26/18 with continued complaints of exertional chest pain with associated SOB and diaphoresis that resolved with rest. He was recommended to present for outpatient LHC.    Hospital Course     Consultants: None   1. CAD s/p PCI/DES to LAD 06/27/18: patient presented for outpatient LHC to further evaluate exertional chest pain. He was found to have single vessel CAD with 99% stenosis of midLAD bifurcation managed with PCI/DES; no other CAD noted. She was recommended to start DAPT with ASA and brilinta for a minimum of 12 months. Creatinine stable post cath  - Continue ASA, brilinta, and statin  2. HTN: BP stable throughout admission - Continue metoprolol XL and benzapril  3. HLD: cholesterol well controlled with LDL 46; at goal of <70 - Continue statin  4. DM type 2: reported to have poorly controlled DM with A1C >10 a few years ago. No A1C available for review. Maintained on an ISS this admission - Resume home medications at discharge. Patient can restart Janumet on 06/30/18 _____________  Discharge Vitals Blood pressure (!) 142/73, pulse 81, temperature 97.9 F (36.6 C), resp. rate 13, height 5\' 6"  (1.676 m), weight 196 lb 3.4 oz (89 kg), SpO2 100 %.  Filed Weights  06/27/18 1134 06/28/18 0635  Weight: 192 lb (87.1 kg) 196 lb 3.4 oz (89 kg)   Physical exam on the day of discharge:  GEN:No acute distress.   Neck:No JVD, no carotid bruits Cardiac: RRR, no murmurs, rubs, or gallops. Right radial cath site C/D/I, without significant ecchymosis or hematoma Respiratory:Clear to auscultation bilaterally, no wheezes/ rales/ rhonchi ER:XVQM, Soft, nontender,  non-distended  MS:No edema; No deformity. Neuro:Nonfocal, moving all extremities spontaneously Psych: Normal affect     Labs & Radiologic Studies    CBC Recent Labs    06/27/18 2245 06/28/18 0236  WBC 9.0 8.2  HGB 13.7 13.4  HCT 41.1 40.9  MCV 88.4 89.7  PLT 207 086   Basic Metabolic Panel Recent Labs    06/26/18 0945 06/27/18 2245 06/28/18 0236  NA 139  --  139  K 4.6  --  3.9  CL 103  --  107  CO2 21  --  24  GLUCOSE 132*  --  180*  BUN 20  --  16  CREATININE 1.14 1.07 0.99  CALCIUM 9.2  --  8.7*   Liver Function Tests No results for input(s): AST, ALT, ALKPHOS, BILITOT, PROT, ALBUMIN in the last 72 hours. No results for input(s): LIPASE, AMYLASE in the last 72 hours. Cardiac Enzymes No results for input(s): CKTOTAL, CKMB, CKMBINDEX, TROPONINI in the last 72 hours. BNP Invalid input(s): POCBNP D-Dimer No results for input(s): DDIMER in the last 72 hours. Hemoglobin A1C No results for input(s): HGBA1C in the last 72 hours. Fasting Lipid Panel Recent Labs    06/26/18 0945  CHOL 123  HDL 33*  LDLCALC 46  TRIG 221*  CHOLHDL 3.7   Thyroid Function Tests No results for input(s): TSH, T4TOTAL, T3FREE, THYROIDAB in the last 72 hours.  Invalid input(s): FREET3 _____________  No results found. Disposition   Patient was seen and examined by Dr. Harrell Gave who deemed patient as stable for discharge. Follow-up has been arranged. Discharge medications as listed below.   Follow-up Plans & Appointments    Follow-up Information    Isaiah Serge, NP Follow up on 07/20/2018.   Specialties:  Cardiology, Radiology Why:  Please arrive 15 minutes early for your 11:00am post hospital appointment Contact information: [address] [place] [address] [phone]          Discharge Instructions    Amb Referral to Cardiac Rehabilitation   Complete by:  As directed    Diagnosis:  Coronary Stents      Discharge Medications    Allergies as of 06/28/2018      Reactions   [place] Other (See Comments)   Unknown, per pt      Medication List    TAKE these medications   aspirin 81 MG tablet Take 81 mg by mouth daily.   benazepril 20 MG tablet Commonly known as:  LOTENSIN Take 20 mg by mouth daily. Takes to protect kidneys   FARXIGA 10 MG Tabs tablet Generic drug:  dapagliflozin propanediol Take 10 mg by mouth daily.   gabapentin 300 MG capsule Commonly known as:  NEURONTIN Take 300 mg by mouth at bedtime.   glimepiride 2 MG tablet Commonly known as:  AMARYL Take 2 mg by mouth daily before breakfast.   JANUMET XR 50-1000 MG Tb24 Generic drug:  SitaGLIPtin-MetFORMIN HCl Take 1 tablet by mouth 2 (two) times daily.   metoprolol succinate 100 MG 24 hr tablet Commonly known as:  TOPROL-XL TAKE ONE TABLET BY MOUTH DAILY.  TAKE WITH OR IMMEDIATELY FOLLOWING MEAL.   nitroGLYCERIN 0.4 MG SL tablet Commonly known as:  NITROSTAT Place 0.4 mg under the tongue every 5 (five) minutes as needed for chest pain (no more than 3 doses).   pantoprazole 40 MG tablet Commonly known as:  PROTONIX Take 40 mg by mouth daily.   simvastatin 40 MG tablet Commonly known as:  ZOCOR Take 40 mg by mouth every evening.   tamsulosin 0.4 MG Caps capsule Commonly known as:  FLOMAX Take 0.8 mg by mouth at bedtime.   ticagrelor 90 MG Tabs tablet Commonly known as:  BRILINTA Take 1 tablet (90 mg total) by mouth 2 (two) times daily.   ticagrelor 90 MG Tabs tablet Commonly known as:  BRILINTA Take 1 tablet (90 mg total) by mouth 2 (two) times daily.        Aspirin prescribed at discharge?  Yes High Intensity Statin Prescribed? (Lipitor 40-80mg  or Crestor 20-40mg ): No - cholesterol well controlled on simvastatin Beta Blocker Prescribed? Yes For EF <40%, was ACEI/ARB Prescribed? Yes ADP Receptor Inhibitor Prescribed? (i.e. Plavix etc.-Includes Medically Managed Patients): Yes For EF <40%, Aldosterone Inhibitor  Prescribed? No: EF >40% Was EF assessed during THIS hospitalization? No: EF assessed prior to admission 65% Was Cardiac Rehab II ordered? (Included Medically managed Patients): Yes   Outstanding Labs/Studies   None  Duration of Discharge Encounter   Greater than 30 minutes including physician time.  Signed, Abigail Butts PA-C 06/28/2018, 10:09 AM

## 2018-06-28 NOTE — Discharge Instructions (Signed)

## 2018-06-28 NOTE — Progress Notes (Signed)
CARDIAC REHAB PHASE I   PRE:  Rate/Rhythm: 87 SR  BP:  Sitting: 139/67      SaO2: 97 RA  MODE:  Ambulation: 700 ft   POST:  Rate/Rhythm: 90 SR with PVCs  BP:  Sitting: 142/73    SaO2: 98 RA   Pt ambulated 767ft in hallway independently with steady gait. Pt denies CP or SOB. Pt and wife educated on importance of Brilinta, ASA, statin, and NTG. Pt very knowledgeable on procedure he had done. Stent card with wife. Pt given heart healthy and diabetic diets. Reviewed restrictions and exercise guidelines. Will refer to CRP II [place].   [id number]  Rufina Falco, RN BSN 06/28/2018 8:46 AM

## 2018-06-28 NOTE — Telephone Encounter (Addendum)
**Note De-Identified Brian Rollins Obfuscation** The pt is being discharged today. Will call tomorrow.

## 2018-06-28 NOTE — Care Management Note (Addendum)
Case Management Note  Patient Details  Name: Brian Rollins MRN: [id number] Date of Birth: [birth year]  Subjective/Objective:  From home with wife, s/p stent intervention, will be on brilinta, RN Celeshia gave patient the 30 day savings coupon, and 5.00 co pay card.   NCM awaitng benefit check.  Wife cell is [phone]. His pharmacy has brlinta in [place].  NCM contacted wife [name] and informed her of co pay of zero dollars.                Action/Plan: DC home when ready,  Expected Discharge Date:  06/28/18               Expected Discharge Plan:  Home/Self Care  In-House Referral:     Discharge planning Services  CM Consult, Medication Assistance  Post Acute Care Choice:    Choice offered to:     DME Arranged:    DME Agency:     HH Arranged:    HH Agency:     Status of Service:  Completed, signed off  If discussed at [employer] of Stay Meetings, dates discussed:    Additional Comments:  Zenon Mayo, RN 06/28/2018, 11:23 AM

## 2018-06-28 NOTE — Telephone Encounter (Signed)
New Message:      TOC per Daleen Snook with [facility] on 07/20/18 @ 11:00

## 2018-06-28 NOTE — Progress Notes (Signed)
#   1. S/W  [employer] [facility] # [phone]    BRILINTA  90 MG BID  COVER- YES  CO-PAY- ZERO DOLLARS  TIER- NO  PRIOR APPROVAL- NO   PREFERRED PHARMACY : YES  [facility]  [address]  [facility]

## 2018-06-29 NOTE — Telephone Encounter (Signed)
lpmtcb 8/1

## 2018-06-30 ENCOUNTER — Telehealth (HOSPITAL_COMMUNITY): Payer: Self-pay

## 2018-06-30 ENCOUNTER — Other Ambulatory Visit: Payer: Self-pay

## 2018-06-30 ENCOUNTER — Encounter (HOSPITAL_COMMUNITY): Payer: Self-pay | Admitting: *Deleted

## 2018-06-30 ENCOUNTER — Emergency Department (HOSPITAL_COMMUNITY): Payer: Commercial Managed Care - PPO

## 2018-06-30 ENCOUNTER — Observation Stay (HOSPITAL_COMMUNITY)
Admission: EM | Admit: 2018-06-30 | Discharge: 2018-07-01 | Disposition: A | Discharge status: Home / Self Care | Payer: Commercial Managed Care - PPO | Attending: Cardiovascular Disease | Admitting: Cardiovascular Disease

## 2018-06-30 DIAGNOSIS — R0602 Shortness of breath: Secondary | ICD-10-CM | POA: Diagnosis not present

## 2018-06-30 DIAGNOSIS — G4733 Obstructive sleep apnea (adult) (pediatric): Secondary | ICD-10-CM | POA: Diagnosis not present

## 2018-06-30 DIAGNOSIS — R0789 Other chest pain: Secondary | ICD-10-CM | POA: Diagnosis not present

## 2018-06-30 DIAGNOSIS — I10 Essential (primary) hypertension: Secondary | ICD-10-CM | POA: Diagnosis not present

## 2018-06-30 DIAGNOSIS — I4510 Unspecified right bundle-branch block: Secondary | ICD-10-CM | POA: Insufficient documentation

## 2018-06-30 DIAGNOSIS — R072 Precordial pain: Secondary | ICD-10-CM

## 2018-06-30 DIAGNOSIS — R079 Chest pain, unspecified: Secondary | ICD-10-CM | POA: Diagnosis not present

## 2018-06-30 DIAGNOSIS — Z87891 Personal history of nicotine dependence: Secondary | ICD-10-CM | POA: Diagnosis not present

## 2018-06-30 DIAGNOSIS — E7800 Pure hypercholesterolemia, unspecified: Secondary | ICD-10-CM | POA: Diagnosis not present

## 2018-06-30 DIAGNOSIS — I25119 Atherosclerotic heart disease of native coronary artery with unspecified angina pectoris: Secondary | ICD-10-CM | POA: Diagnosis not present

## 2018-06-30 DIAGNOSIS — Z955 Presence of coronary angioplasty implant and graft: Secondary | ICD-10-CM | POA: Insufficient documentation

## 2018-06-30 DIAGNOSIS — Z7982 Long term (current) use of aspirin: Secondary | ICD-10-CM | POA: Diagnosis not present

## 2018-06-30 DIAGNOSIS — Z7984 Long term (current) use of oral hypoglycemic drugs: Secondary | ICD-10-CM | POA: Diagnosis not present

## 2018-06-30 DIAGNOSIS — I2510 Atherosclerotic heart disease of native coronary artery without angina pectoris: Secondary | ICD-10-CM | POA: Diagnosis not present

## 2018-06-30 DIAGNOSIS — Z7902 Long term (current) use of antithrombotics/antiplatelets: Secondary | ICD-10-CM | POA: Diagnosis not present

## 2018-06-30 DIAGNOSIS — E119 Type 2 diabetes mellitus without complications: Secondary | ICD-10-CM | POA: Diagnosis not present

## 2018-06-30 DIAGNOSIS — R42 Dizziness and giddiness: Secondary | ICD-10-CM | POA: Insufficient documentation

## 2018-06-30 DIAGNOSIS — K219 Gastro-esophageal reflux disease without esophagitis: Secondary | ICD-10-CM | POA: Insufficient documentation

## 2018-06-30 DIAGNOSIS — Z79899 Other long term (current) drug therapy: Secondary | ICD-10-CM | POA: Insufficient documentation

## 2018-06-30 LAB — BASIC METABOLIC PANEL WITH GFR
Anion gap: 14 (ref 5–15)
BUN: 10 mg/dL (ref 8–23)
CO2: 24 mmol/L (ref 22–32)
Calcium: 9.2 mg/dL (ref 8.9–10.3)
Chloride: 104 mmol/L (ref 98–111)
Creatinine, Ser: 1.06 mg/dL (ref 0.61–1.24)
GFR calc Af Amer: >60 mL/min
GFR calc non Af Amer: >60 mL/min
Glucose, Bld: 131 mg/dL — ABNORMAL HIGH (ref 70–99)
Potassium: 4.3 mmol/L (ref 3.5–5.1)
Sodium: 142 mmol/L (ref 135–145)

## 2018-06-30 LAB — CBC
HEMATOCRIT: 43.9 % (ref 39.0–52.0)
HEMOGLOBIN: 14.4 g/dL (ref 13.0–17.0)
MCH: 29.1 pg (ref 26.0–34.0)
MCHC: 32.8 g/dL (ref 30.0–36.0)
MCV: 88.9 fL (ref 78.0–100.0)
Platelets: 214 10*3/uL (ref 150–400)
RBC: 4.94 MIL/uL (ref 4.22–5.81)
RDW: 12.9 % (ref 11.5–15.5)
WBC: 6.7 10*3/uL (ref 4.0–10.5)

## 2018-06-30 LAB — I-STAT TROPONIN, ED: Troponin i, poc: 0.07 ng/mL (ref 0.00–0.08)

## 2018-06-30 LAB — GLUCOSE, CAPILLARY: Glucose-Capillary: 192 mg/dL — ABNORMAL HIGH (ref 70–99)

## 2018-06-30 LAB — TROPONIN I: Troponin I: 0.07 ng/mL (ref <0.03)

## 2018-06-30 MED ORDER — PANTOPRAZOLE SODIUM 40 MG PO TBEC
40.0000 mg | DELAYED_RELEASE_TABLET | Freq: Every day | ORAL | Status: DC
  Administered 2018-07-01: 40 mg via ORAL
  Filled 2018-06-30: qty 1

## 2018-06-30 MED ORDER — ACETAMINOPHEN 325 MG PO TABS
650.0000 mg | ORAL_TABLET | ORAL | Status: DC | PRN
Start: 2018-06-30 — End: 2018-07-01

## 2018-06-30 MED ORDER — TAMSULOSIN HCL 0.4 MG PO CAPS
0.8000 mg | ORAL_CAPSULE | Freq: Every day | ORAL | Status: DC
  Administered 2018-06-30: 0.8 mg via ORAL
  Filled 2018-06-30: qty 2

## 2018-06-30 MED ORDER — GABAPENTIN 300 MG PO CAPS
300.0000 mg | ORAL_CAPSULE | Freq: Every day | ORAL | Status: DC
  Administered 2018-06-30: 300 mg via ORAL
  Filled 2018-06-30: qty 1

## 2018-06-30 MED ORDER — INSULIN ASPART 100 UNIT/ML ~~LOC~~ SOLN: 0.0000 [IU] | Freq: Every day | SUBCUTANEOUS | Status: DC

## 2018-06-30 MED ORDER — NITROGLYCERIN 0.4 MG SL SUBL: 0.4000 mg | SUBLINGUAL_TABLET | SUBLINGUAL | Status: DC | PRN

## 2018-06-30 MED ORDER — METOPROLOL SUCCINATE ER 100 MG PO TB24
100.0000 mg | ORAL_TABLET | Freq: Every day | ORAL | Status: DC
  Administered 2018-07-01: 100 mg via ORAL
  Filled 2018-06-30: qty 1

## 2018-06-30 MED ORDER — ASPIRIN 81 MG PO CHEW
81.0000 mg | CHEWABLE_TABLET | Freq: Every day | ORAL | Status: DC
  Administered 2018-07-01: 81 mg via ORAL
  Filled 2018-06-30: qty 1

## 2018-06-30 MED ORDER — SIMVASTATIN 40 MG PO TABS
40.0000 mg | ORAL_TABLET | Freq: Every evening | ORAL | Status: DC
  Administered 2018-06-30: 40 mg via ORAL
  Filled 2018-06-30: qty 1

## 2018-06-30 MED ORDER — INSULIN ASPART 100 UNIT/ML ~~LOC~~ SOLN
0.0000 [IU] | Freq: Three times a day (TID) | SUBCUTANEOUS | Status: DC
  Administered 2018-07-01: 2 [IU] via SUBCUTANEOUS

## 2018-06-30 MED ORDER — TICAGRELOR 90 MG PO TABS
90.0000 mg | ORAL_TABLET | Freq: Two times a day (BID) | ORAL | Status: DC
  Administered 2018-06-30 – 2018-07-01 (×2): 90 mg via ORAL
  Filled 2018-06-30 (×2): qty 1

## 2018-06-30 MED ORDER — ONDANSETRON HCL 4 MG/2ML IJ SOLN: 4.0000 mg | Freq: Four times a day (QID) | INTRAMUSCULAR | Status: DC | PRN

## 2018-06-30 MED ORDER — BENAZEPRIL HCL 5 MG PO TABS
20.0000 mg | ORAL_TABLET | Freq: Every evening | ORAL | Status: DC
  Administered 2018-06-30: 20 mg via ORAL
  Filled 2018-06-30: qty 1

## 2018-06-30 MED ORDER — HEPARIN SODIUM (PORCINE) 5000 UNIT/ML IJ SOLN
5000.0000 [IU] | Freq: Three times a day (TID) | INTRAMUSCULAR | Status: DC
  Administered 2018-06-30 – 2018-07-01 (×2): 5000 [IU] via SUBCUTANEOUS
  Filled 2018-06-30 (×2): qty 1

## 2018-06-30 MED ORDER — NITROGLYCERIN 0.4 MG SL SUBL
0.4000 mg | SUBLINGUAL_TABLET | SUBLINGUAL | Status: DC | PRN
  Administered 2018-06-30 (×2): 0.4 mg via SUBLINGUAL
  Filled 2018-06-30: qty 1

## 2018-06-30 MED ORDER — ASPIRIN 81 MG PO CHEW
324.0000 mg | CHEWABLE_TABLET | Freq: Once | ORAL | Status: AC
  Administered 2018-06-30: 324 mg via ORAL
  Filled 2018-06-30: qty 4

## 2018-06-30 NOTE — ED Triage Notes (Signed)
Pt in c/o chest pain that started about an hour ago, pt had a stent placed on Tuesday, pt also reports dizziness and shortness of breath

## 2018-06-30 NOTE — ED Notes (Signed)
Pt significant other came out to nurses station to ask how long it will be before the cardiologist comes to see the pt. Dr. Lita Mains to page cards again.

## 2018-06-30 NOTE — ED Notes (Signed)
Patient transported to X-ray 

## 2018-06-30 NOTE — ED Notes (Signed)
Dinner tray delivered.

## 2018-06-30 NOTE — Telephone Encounter (Signed)
Attempted to make initial call to patient in regards to Cardiac Rehab - lm on vm °

## 2018-06-30 NOTE — Telephone Encounter (Signed)
Patients insurance is active and benefits verified through [facility] - $60.00 co-pay, no deductible, out of pocket amount of $6,000/$0.00 has been met, no co-insurance, and no pre-authorization is required. Reference #[id number]  Will contact patient to see if he is interested in the Cardiac Rehab Program. If interested, patient will need to complete follow up appt. Once completed, patient will be contacted for scheduling.

## 2018-06-30 NOTE — Consult Note (Signed)
Cardiology Consultation:   Patient ID: Brian Rollins; [id number]; 06-01-1956   Admit date: 06/30/2018 Date of Consult: 06/30/2018  Primary Care Provider: Lawerance Cruel, MD Primary Cardiologist: Jenkins Rouge, MD   Patient Profile:   Brian Rollins is a 62 y.o. male with a hx of hypertension, hyperlipidemia, diabetes type 2, OSA, CAD s/p recent stent to mid LAD 06/27/2018 who is being seen today for the evaluation of pain at the request of Dr. Lita Mains.  History of Present Illness:   Brian Rollins recently underwent left heart cath for evaluation of exertional chest discomfort.  He was found to have single-vessel disease with 99% stenosis at the mid LAD bifurcation with a large second diagonal 50%.  He underwent successful PCI with drug-eluting stent.  He was discharged on dual antiplatelet therapy with Brilinta and aspirin.  This morning around 9:30, just after having had a soft/semi-loose BM, he was walking around the house and felt lightheaded and then developed a localized soreness in his left chest that he can pinpoint and he says was reproducible earlier when he pushed on it.  It is not reproducible at this time.  He thinks that he may have had some mild shortness of breath as well.  This was not anything like the chest discomfort that he had prior to his stent.  Due to the recent stent placement he was concerned and came to the ED.  Once here he was given 2 sublingual nitroglycerin and his pain resolved shortly after.  It has not returned.  He feels very well right now and feels like he wants to go home.  His physical assessment is normal and he appears very comfortable.  BMP and CBC are unremarkable.  Chest x-ray is normal.  EKG shows right bundle branch block with T wave inversions that is unchanged from prior.  His troponin is mildly elevated at 0.07 which could be residual after his coronary intervention.  Past Medical History:  Diagnosis Date  . Chest discomfort   . Chest pain on  exertion   . GERD (gastroesophageal reflux disease)   . High cholesterol   . Hydrocele, left   . Hypertension   . Keratoconus   . OSA (obstructive sleep apnea)    "mild; don't wear mask" (06/27/2018)  . Pneumonia 1980s X 1  . Rapid heart beat   . Type II diabetes mellitus ([place])     Past Surgical History:  Procedure Laterality Date  . CORONARY ANGIOPLASTY WITH STENT PLACEMENT  06/27/2018  . CORONARY STENT INTERVENTION N/A 06/27/2018   Procedure: CORONARY STENT INTERVENTION;  Surgeon: Belva Crome, MD;  Location: [facility];  Service: Cardiovascular;  Laterality: N/A;  . HYDROCELE EXCISION  12/03/2011   Procedure: HYDROCELECTOMY ADULT;  Surgeon: Claybon Jabs, MD;  Location: [facility];  Service: Urology;  Laterality: Left;  . KNEE ARTHROSCOPY Bilateral 2000s X 2   "removed scar tissue on the right; repaired torn meniscus"  . KNEE ARTHROSCOPY W/ ACL RECONSTRUCTION Right 1990s  . RIGHT/LEFT HEART CATH AND CORONARY ANGIOGRAPHY N/A 06/27/2018   Procedure: RIGHT/LEFT HEART CATH AND CORONARY ANGIOGRAPHY;  Surgeon: Belva Crome, MD;  Location: [facility];  Service: Cardiovascular;  Laterality: N/A;     Home Medications:  Prior to Admission medications   Medication Sig Start Date End Date Taking? Authorizing Provider  aspirin 81 MG tablet Take 81 mg by mouth daily.     [provider]  benazepril (LOTENSIN) 20 MG tablet Take  20 mg by mouth daily. Takes to protect kidneys    [provider]  FARXIGA 10 MG TABS tablet Take 10 mg by mouth daily. 03/04/17   [provider]  gabapentin (NEURONTIN) 300 MG capsule Take 300 mg by mouth at bedtime.    [provider]  glimepiride (AMARYL) 2 MG tablet Take 2 mg by mouth daily before breakfast.      [provider]  metoprolol succinate (TOPROL-XL) 100 MG 24 hr tablet TAKE ONE TABLET BY MOUTH DAILY. TAKE WITH OR IMMEDIATELY FOLLOWING MEAL. 04/03/18   Josue Hector, MD    nitroGLYCERIN (NITROSTAT) 0.4 MG SL tablet Place 0.4 mg under the tongue every 5 (five) minutes as needed for chest pain (no more than 3 doses).     [provider]  pantoprazole (PROTONIX) 40 MG tablet Take 40 mg by mouth daily. 03/29/17   [provider]  simvastatin (ZOCOR) 40 MG tablet Take 40 mg by mouth every evening.     [provider]  SitaGLIPtin-MetFORMIN HCl (JANUMET XR) 50-1000 MG TB24 Take 1 tablet by mouth 2 (two) times daily.    [provider]  tamsulosin (FLOMAX) 0.4 MG CAPS capsule Take 0.8 mg by mouth at bedtime.     [provider]  ticagrelor (BRILINTA) 90 MG TABS tablet Take 1 tablet (90 mg total) by mouth 2 (two) times daily. 06/28/18   Kroeger, Lorelee Cover., PA-C  ticagrelor (BRILINTA) 90 MG TABS tablet Take 1 tablet (90 mg total) by mouth 2 (two) times daily. 06/28/18   Kroeger, Lorelee Cover., PA-C    Inpatient Medications: Scheduled Meds:  Continuous Infusions:  PRN Meds: nitroGLYCERIN  Allergies:    Allergies  Allergen Reactions  . Mercury Other (See Comments)    Unknown, per pt    Social History:   Social History   Socioeconomic History  . Marital status: Married    Spouse name: donna  . Number of children: 2  . Years of education: college  . Highest education level: Not on file  Occupational History  . 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 Financial resource strain: Not on file  . Food insecurity:    Worry: Not on file    Inability: Not on file  . Transportation needs:    Medical: Not on file    Non-medical: Not on file  Tobacco Use  . Smoking status: Former Smoker    Packs/day: 0.50    Years: 3.00    Pack years: 1.50    Types: Cigarettes  . Smokeless tobacco: Former Systems developer  . Tobacco comment: 06/27/2018 "quit smoking & using chew  in the early 1980s"  Substance and Sexual Activity  . Alcohol use: Yes    Alcohol/week: 0.0 oz    Comment: 06/27/2018 "might drink once/month"  . Drug use: Never  .  Sexual activity: Not Currently  Lifestyle  . Physical activity:    Days per week: Not on file    Minutes per session: Not on file  . Stress: Not on file  Relationships  . Social connections:    Talks on phone: Not on file    Gets together: Not on file    Attends religious service: Not on file    Active member of club or organization: Not on file    Attends meetings of clubs or organizations: Not on file    Relationship status: Not on file  . Intimate partner violence:    Fear of current  or ex partner: Not on file    Emotionally abused: Not on file    Physically abused: Not on file    Forced sexual activity: Not on file  Other Topics Concern  . Not on file  Social History Narrative  . Not on file    Family History:    Family History  Adopted: Yes  Family history unknown: Yes     ROS:  Please see the history of present illness.   All other ROS reviewed and negative.     Physical Exam/Data:   Vitals:   06/30/18 1345 06/30/18 1400 06/30/18 1430 06/30/18 1603  BP: 119/82 114/76 119/79 126/83  Pulse: 73 72 (!) 127 65  Resp: 14 20 15 12   Temp:      TempSrc:      SpO2: 100% 100% 100% 100%  Weight:      Height:       No intake or output data in the 24 hours ending 06/30/18 1609 Filed Weights   06/30/18 1310  Weight: 196 lb (88.9 kg)   Body mass index is 31.64 kg/m.  General:  Well nourished, well developed, in no acute distress HEENT: normal Lymph: no adenopathy Neck: no JVD Endocrine:  No thryomegaly Vascular: No carotid bruits; FA pulses 2+ bilaterally without bruits  Cardiac:  normal S1, S2; RRR; no murmur  Lungs:  clear to auscultation bilaterally, no wheezing, rhonchi or rales  Abd: soft, nontender, no hepatomegaly  Ext: no edema Musculoskeletal:  No deformities, BUE and BLE strength normal and equal Skin: warm and dry  Neuro:  CNs 2-12 intact, no focal abnormalities noted Psych:  Normal affect   EKG:  The EKG was personally reviewed and  demonstrates:  Normal sinus rhythm with right bundle branch block Telemetry:  Telemetry was personally reviewed and demonstrates: Normal sinus rhythm  Relevant CV Studies:  Right/Left heart catheterization 06/27/18:  A stent was successfully placed.   Single-vessel disease with complex [place] 1, 1, 1 mid LAD bifurcation 99% stenosis with the large second diagonal 50%. TIMI grade II flow was noted in the LAD. The LAD is otherwise normal.  Normal left main  Normal circumflex coronary artery, which is dominant.  Nondominant right coronary.  Successful complex bifurcation PCI reducing 99% LAD stenosis to less than 10% using a 3.5 x 20 mm Onyx postdilated to 4.0 mm at high pressure improving TIMI grade II flow to TIMI grade III. Post stent deployment the diagonal contains 95% ostial stenosis and was reduced to 70% using kissing balloon technique. Diagonal flow remained TIMI grade III.  Recommendations:   Aspirin and Brilinta x6 months. Thereafter would recommend aspirin and clopidogrel for an additional 6 months before dropping clopidogrel.  Aggressive risk factor modification  Recommend uninterrupted dual antiplatelet therapy with Aspirin 81mg  daily and Ticagrelor 90mg  twice dailyfor a minimum of 12 months (ACS - Class I recommendation).    Laboratory Data:  Chemistry Recent Labs  Lab 06/26/18 0945 06/27/18 2245 06/28/18 0236 06/30/18 1201  NA 139  --  139 142  K 4.6  --  3.9 4.3  CL 103  --  107 104  CO2 21  --  24 24  GLUCOSE 132*  --  180* 131*  BUN 20  --  16 10  CREATININE 1.14 1.07 0.99 1.06  CALCIUM 9.2  --  8.7* 9.2  GFRNONAA 69 >60 >60 >60  GFRAA 80 >60 >60 >60  ANIONGAP  --   --  8 14  No results for input(s): PROT, ALBUMIN, AST, ALT, ALKPHOS, BILITOT in the last 168 hours. Hematology Recent Labs  Lab 06/27/18 2245 06/28/18 0236 06/30/18 1201  WBC 9.0 8.2 6.7  RBC 4.65 4.56 4.94  HGB 13.7 13.4 14.4  HCT 41.1 40.9 43.9  MCV 88.4 89.7 88.9   MCH 29.5 29.4 29.1  MCHC 33.3 32.8 32.8  RDW 13.3 13.4 12.9  PLT 207 210 214   Cardiac EnzymesNo results for input(s): TROPONINI in the last 168 hours.  Recent Labs  Lab 06/30/18 1214  TROPIPOC 0.07    BNPNo results for input(s): BNP, PROBNP in the last 168 hours.  DDimer No results for input(s): DDIMER in the last 168 hours.  Radiology/Studies:  Dg Chest 2 View  Result Date: 06/30/2018 CLINICAL DATA:  62 year old male with 1 hour of chest pain. Recent coronary intervention. Dizziness and shortness of breath. EXAM: CHEST - 2 VIEW COMPARISON:  None. FINDINGS: Lung volumes and mediastinal contours are normal. A coronary artery stent is evident on the lateral view. Visualized tracheal air column is within normal limits. Both lungs are clear. No pneumothorax or pleural effusion. No acute osseous abnormality identified. Negative visible bowel gas pattern. IMPRESSION: No cardiopulmonary abnormality. Electronically Signed   By: Genevie Ann M.D.   On: 06/30/2018 12:36    Assessment and Plan:   Chest pain: -Patient with recent stent placement.  This morning while walking around the house he developed mild dizziness and a localized left chest tenderness that he says was reproducible when he pushed on it.  He also had some mild shortness of breath.  This was not anything like his symptoms prior to his stent.  His discomfort resolved shortly after he was in the emergency department after sublingual nitroglycerin x2.  He has not had any return of symptoms and he is feeling quite well. -His troponin is 0.07, may be slightly elevated related to recent PCI -Chest x-ray showed no cardiopulmonary abnormality -Cath films reviewed by Dr. Claiborne Billings. -Symptoms are mostly atypical and not likely cardiac in origin, but with his complex lesion PCI on 06/27/18, will admit for observation overnight. -Will trend troponins and recheck EKG in am. If no further symptoms and testing is negative will plan for discharge tomorrow.   -Will continue home meds.   CAD s/p PCI/DES to LAD 06/27/18:  -LHC done on 06/27/2018 to further evaluate exertional chest pressure with finding of single vessel CAD with 99% stenosis of midLAD bifurcation managed with PCI/DES; no other CAD noted. Started on DAPT with ASA and brilinta for a minimum of 12 months. Creatinine stable post cath  - Continue ASA, brilinta, blocker, ACE inhibitor and statin  HTN:  - Continue metoprolol XL and benazapril -Blood pressure is stable and well-controlled  HLD:  -cholesterol well controlled with LDL 46 06/26/18; at goal of <70 - Continue statin  DM type 2:  -reported to have poorly controlled DM with A1C >10 a few years ago.  -Will order SSI while hospitalized.   For questions or updates, please contact [place] Please consult [email or web address] for contact info under Cardiology/STEMI.   Signed, Daune Perch, NP  06/30/2018 4:09 PM

## 2018-06-30 NOTE — H&P (Addendum)
Cardiology H&P:   Patient ID: Andrell Bergeson; [id number]; 03/29/1956   Admit date: 06/30/2018  Primary Care Provider: Lawerance Cruel, MD Primary Cardiologist: Jenkins Rouge, MD   Patient Profile:   Devine Klingel is a 62 y.o. male with a hx of hypertension, hyperlipidemia, diabetes type 2, OSA, CAD s/p recent stent to mid LAD 06/27/2018 who is being seen today for the evaluation of pain at the request of Dr. Lita Mains.  History of Present Illness:   Mr. Cherne recently underwent left heart cath for evaluation of exertional chest discomfort.  He was found to have single-vessel disease with 99% stenosis at the mid LAD bifurcation with a large second diagonal 50%.  He underwent successful PCI with drug-eluting stent.  He was discharged on dual antiplatelet therapy with Brilinta and aspirin.  This morning around 9:30, just after having had a soft/semi-loose BM, he was walking around the house and felt lightheaded and then developed a localized soreness in his left chest that he can pinpoint and he says was reproducible earlier when he pushed on it.  It is not reproducible at this time.  He thinks that he may have had some mild shortness of breath as well.  This was not anything like the chest discomfort that he had prior to his stent.  Due to the recent stent placement he was concerned and came to the ED.  Once here he was given 2 sublingual nitroglycerin and his pain resolved shortly after.  It has not returned.  He feels very well right now and feels like he wants to go home.  His physical assessment is normal and he appears very comfortable.  BMP and CBC are unremarkable.  Chest x-ray is normal.  EKG shows right bundle branch block with T wave inversions that is unchanged from prior.  His troponin is mildly elevated at 0.07 which could be residual after his coronary intervention.  Past Medical History:  Diagnosis Date  . Chest discomfort   . Chest pain on exertion   . GERD (gastroesophageal  reflux disease)   . High cholesterol   . Hydrocele, left   . Hypertension   . Keratoconus   . OSA (obstructive sleep apnea)    "mild; don't wear mask" (06/27/2018)  . Pneumonia 1980s X 1  . Rapid heart beat   . Type II diabetes mellitus ([place])     Past Surgical History:  Procedure Laterality Date  . CORONARY ANGIOPLASTY WITH STENT PLACEMENT  06/27/2018  . CORONARY STENT INTERVENTION N/A 06/27/2018   Procedure: CORONARY STENT INTERVENTION;  Surgeon: Belva Crome, MD;  Location: [facility];  Service: Cardiovascular;  Laterality: N/A;  . HYDROCELE EXCISION  12/03/2011   Procedure: HYDROCELECTOMY ADULT;  Surgeon: Claybon Jabs, MD;  Location: [facility];  Service: Urology;  Laterality: Left;  . KNEE ARTHROSCOPY Bilateral 2000s X 2   "removed scar tissue on the right; repaired torn meniscus"  . KNEE ARTHROSCOPY W/ ACL RECONSTRUCTION Right 1990s  . RIGHT/LEFT HEART CATH AND CORONARY ANGIOGRAPHY N/A 06/27/2018   Procedure: RIGHT/LEFT HEART CATH AND CORONARY ANGIOGRAPHY;  Surgeon: Belva Crome, MD;  Location: [facility];  Service: Cardiovascular;  Laterality: N/A;     Home Medications:  Prior to Admission medications   Medication Sig Start Date End Date Taking? Authorizing Provider  aspirin 81 MG tablet Take 81 mg by mouth daily.     [provider]  benazepril (LOTENSIN) 20 MG tablet Take 20 mg by mouth  daily. Takes to protect kidneys    [provider]  FARXIGA 10 MG TABS tablet Take 10 mg by mouth daily. 03/04/17   [provider]  gabapentin (NEURONTIN) 300 MG capsule Take 300 mg by mouth at bedtime.    [provider]  glimepiride (AMARYL) 2 MG tablet Take 2 mg by mouth daily before breakfast.      [provider]  metoprolol succinate (TOPROL-XL) 100 MG 24 hr tablet TAKE ONE TABLET BY MOUTH DAILY. TAKE WITH OR IMMEDIATELY FOLLOWING MEAL. 04/03/18   Josue Hector, MD  nitroGLYCERIN (NITROSTAT) 0.4 MG SL tablet  Place 0.4 mg under the tongue every 5 (five) minutes as needed for chest pain (no more than 3 doses).     [provider]  pantoprazole (PROTONIX) 40 MG tablet Take 40 mg by mouth daily. 03/29/17   [provider]  simvastatin (ZOCOR) 40 MG tablet Take 40 mg by mouth every evening.     [provider]  SitaGLIPtin-MetFORMIN HCl (JANUMET XR) 50-1000 MG TB24 Take 1 tablet by mouth 2 (two) times daily.    [provider]  tamsulosin (FLOMAX) 0.4 MG CAPS capsule Take 0.8 mg by mouth at bedtime.     [provider]  ticagrelor (BRILINTA) 90 MG TABS tablet Take 1 tablet (90 mg total) by mouth 2 (two) times daily. 06/28/18   Kroeger, Lorelee Cover., PA-C  ticagrelor (BRILINTA) 90 MG TABS tablet Take 1 tablet (90 mg total) by mouth 2 (two) times daily. 06/28/18   Kroeger, Lorelee Cover., PA-C    Inpatient Medications: Scheduled Meds:  Continuous Infusions:  PRN Meds: nitroGLYCERIN  Allergies:    Allergies  Allergen Reactions  . Mercury Other (See Comments)    Unknown, per pt    Social History:   Social History   Socioeconomic History  . Marital status: Married    Spouse name: donna  . Number of children: 2  . Years of education: college  . Highest education level: Not on file  Occupational History  . 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 Financial resource strain: Not on file  . Food insecurity:    Worry: Not on file    Inability: Not on file  . Transportation needs:    Medical: Not on file    Non-medical: Not on file  Tobacco Use  . Smoking status: Former Smoker    Packs/day: 0.50    Years: 3.00    Pack years: 1.50    Types: Cigarettes  . Smokeless tobacco: Former Systems developer  . Tobacco comment: 06/27/2018 "quit smoking & using chew  in the early 1980s"  Substance and Sexual Activity  . Alcohol use: Yes    Alcohol/week: 0.0 oz    Comment: 06/27/2018 "might drink once/month"  . Drug use: Never  . Sexual activity: Not Currently  Lifestyle    . Physical activity:    Days per week: Not on file    Minutes per session: Not on file  . Stress: Not on file  Relationships  . Social connections:    Talks on phone: Not on file    Gets together: Not on file    Attends religious service: Not on file    Active member of club or organization: Not on file    Attends meetings of clubs or organizations: Not on file    Relationship status: Not on file  . Intimate partner violence:    Fear of current or ex partner: Not  on file    Emotionally abused: Not on file    Physically abused: Not on file    Forced sexual activity: Not on file  Other Topics Concern  . Not on file  Social History Narrative  . Not on file    Family History:    Family History  Adopted: Yes  Family history unknown: Yes     ROS:  Please see the history of present illness.   All other ROS reviewed and negative.     Physical Exam/Data:   Vitals:   06/30/18 1345 06/30/18 1400 06/30/18 1430 06/30/18 1603  BP: 119/82 114/76 119/79 126/83  Pulse: 73 72 (!) 127 65  Resp: 14 20 15 12   Temp:      TempSrc:      SpO2: 100% 100% 100% 100%  Weight:      Height:       No intake or output data in the 24 hours ending 06/30/18 1609 Filed Weights   06/30/18 1310  Weight: 196 lb (88.9 kg)   Body mass index is 31.64 kg/m.  General:  Well nourished, well developed, in no acute distress HEENT: normal Lymph: no adenopathy Neck: no JVD Endocrine:  No thryomegaly Vascular: No carotid bruits; FA pulses 2+ bilaterally without bruits  Cardiac:  normal S1, S2; RRR; no murmur  Lungs:  clear to auscultation bilaterally, no wheezing, rhonchi or rales  Abd: soft, nontender, no hepatomegaly  Ext: no edema Musculoskeletal:  No deformities, BUE and BLE strength normal and equal Skin: warm and dry  Neuro:  CNs 2-12 intact, no focal abnormalities noted Psych:  Normal affect   EKG:  The EKG was personally reviewed and demonstrates:  Normal sinus rhythm with right bundle  branch block Telemetry:  Telemetry was personally reviewed and demonstrates: Normal sinus rhythm  Relevant CV Studies:  Right/Left heart catheterization 06/27/18:  A stent was successfully placed.   Single-vessel disease with complex [place] 1, 1, 1 mid LAD bifurcation 99% stenosis with the large second diagonal 50%. TIMI grade II flow was noted in the LAD. The LAD is otherwise normal.  Normal left main  Normal circumflex coronary artery, which is dominant.  Nondominant right coronary.  Successful complex bifurcation PCI reducing 99% LAD stenosis to less than 10% using a 3.5 x 20 mm Onyx postdilated to 4.0 mm at high pressure improving TIMI grade II flow to TIMI grade III. Post stent deployment the diagonal contains 95% ostial stenosis and was reduced to 70% using kissing balloon technique. Diagonal flow remained TIMI grade III.  Recommendations:   Aspirin and Brilinta x6 months. Thereafter would recommend aspirin and clopidogrel for an additional 6 months before dropping clopidogrel.  Aggressive risk factor modification  Recommend uninterrupted dual antiplatelet therapy with Aspirin 81mg  daily and Ticagrelor 90mg  twice dailyfor a minimum of 12 months (ACS - Class I recommendation).    Laboratory Data:  Chemistry Recent Labs  Lab 06/26/18 0945 06/27/18 2245 06/28/18 0236 06/30/18 1201  NA 139  --  139 142  K 4.6  --  3.9 4.3  CL 103  --  107 104  CO2 21  --  24 24  GLUCOSE 132*  --  180* 131*  BUN 20  --  16 10  CREATININE 1.14 1.07 0.99 1.06  CALCIUM 9.2  --  8.7* 9.2  GFRNONAA 69 >60 >60 >60  GFRAA 80 >60 >60 >60  ANIONGAP  --   --  8 14    No results for input(s):  PROT, ALBUMIN, AST, ALT, ALKPHOS, BILITOT in the last 168 hours. Hematology Recent Labs  Lab 06/27/18 2245 06/28/18 0236 06/30/18 1201  WBC 9.0 8.2 6.7  RBC 4.65 4.56 4.94  HGB 13.7 13.4 14.4  HCT 41.1 40.9 43.9  MCV 88.4 89.7 88.9  MCH 29.5 29.4 29.1  MCHC 33.3 32.8 32.8  RDW  13.3 13.4 12.9  PLT 207 210 214   Cardiac EnzymesNo results for input(s): TROPONINI in the last 168 hours.  Recent Labs  Lab 06/30/18 1214  TROPIPOC 0.07    BNPNo results for input(s): BNP, PROBNP in the last 168 hours.  DDimer No results for input(s): DDIMER in the last 168 hours.  Radiology/Studies:  Dg Chest 2 View  Result Date: 06/30/2018 CLINICAL DATA:  62 year old male with 1 hour of chest pain. Recent coronary intervention. Dizziness and shortness of breath. EXAM: CHEST - 2 VIEW COMPARISON:  None. FINDINGS: Lung volumes and mediastinal contours are normal. A coronary artery stent is evident on the lateral view. Visualized tracheal air column is within normal limits. Both lungs are clear. No pneumothorax or pleural effusion. No acute osseous abnormality identified. Negative visible bowel gas pattern. IMPRESSION: No cardiopulmonary abnormality. Electronically Signed   By: Genevie Ann M.D.   On: 06/30/2018 12:36    Assessment and Plan:   Chest pain: -Patient with recent stent placement.  This morning while walking around the house he developed mild dizziness and a localized left chest tenderness that he says was reproducible when he pushed on it.  He also had some mild shortness of breath.  This was not anything like his symptoms prior to his stent.  His discomfort resolved shortly after he was in the emergency department after sublingual nitroglycerin x2.  He has not had any return of symptoms and he is feeling quite well. -His troponin is 0.07, may be slightly elevated related to recent PCI -Chest x-ray showed no cardiopulmonary abnormality -Cath films reviewed by Dr. Claiborne Billings. -Symptoms are mostly atypical and not likely cardiac in origin, but with his complex lesion PCI on 06/27/18, will admit for observation overnight. -Will trend troponins and recheck EKG in am. If no further symptoms and testing is negative will plan for discharge tomorrow.  -Will continue home meds.   CAD s/p PCI/DES  to LAD 06/27/18:  -LHC done on 06/27/2018 to further evaluate exertional chest pressure with finding of single vessel CAD with 99% stenosis of midLAD bifurcation managed with PCI/DES; no other CAD noted. Started on DAPT with ASA and brilinta for a minimum of 12 months. Creatinine stable post cath  - Continue ASA, brilinta, blocker, ACE inhibitor and statin  HTN:  - Continue metoprolol XL and benazapril -Blood pressure is stable and well-controlled  HLD:  -cholesterol well controlled with LDL 46 06/26/18; at goal of <70 - Continue statin  DM type 2:  -reported to have poorly controlled DM with A1C >10 a few years ago.  -Will order SSI while hospitalized.    For questions or updates, please contact [facility] Please consult [email or web address] for contact info under Cardiology/STEMI.   Signed, Daune Perch, NP  06/30/2018 4:09 PM  Patient seen and examined. Agree with assessment and plan.  Mr. Mitchelle Goerner is a very pleasant 62 year old gentleman who has a history of hypertension, hyperlipidemia, diabetes mellitus, obstructive sleep apnea, and recently had developed chest discomfort with shortness of breath with step climbing.  He underwent cardiac catheterization June 27, 2018 and was found to have a complex bifurcation stenosis involving  the LAD and a large second diagonal branch.  He underwent difficult and complex intervention by Dr. Tamala Julian with ultimate insertion of a stent in his LAD and he had intervention with PTCA into the ostium of the diagonal.  During the procedure he initially had significant ostial jailing of the diagonal vessel which improved with subsequent dilatation through the LAD stent strut.  He went home the following day.  He had noticed significant improvement in prior symptomatology.  Today he experienced a different chest discomfort which seemed to be more localized in the left chest and was associated with mild shortness of breath.  He believes this was different from  his prior symptomatology.  He presented to the emergency room where he was given 2 sublingual nitroglycerin with resolution of discomfort since.  Presently he is pain-free.  Vital signs are stable.  Pressure 126/83.  Pulse is in the 60s.  HEENT is unremarkable.  There is no JVD.  His lungs are clear.  He did not have costochondral tenderness to palpation.  There was mild sensation of the left pectoral region to deep palpation.  Abdomen was soft and nontender.  Pulses were 2+.  There was no clubbing cyanosis or edema.  Right radial cath site was stable.  ECG does not show any acute change but reveals sinus rhythm with right bundle branch block and repolarization changes.  There are mild ST changes in V5.  Point-of-care troponin is mildly elevated at 0.07.  Presently, the patient feels well.  His mild troponin elevation may be secondary to potential slight troponin bump following his procedure but no troponins were checked to know for sure and for comparative purposes.  Alternatively his chest pain today may be responsible for a slight troponin bump if this was ischemic.  I suspect his chest pain most likely is musculoskeletal.  However, with the complexity of his bifurcation stenosis ([id number]) I feel the most prudent course of action is to keep him overnight for observation.  Plan to obtain serial troponins and will repeat ECG in a.m.  If troponins are trending downward and ECG remained stable without recurrence of dermatology plan probable discharge in early a.m.   Troy Sine, MD, [facility] 06/30/2018 6:14 PM

## 2018-06-30 NOTE — ED Notes (Signed)
Cards at bedside

## 2018-06-30 NOTE — Telephone Encounter (Signed)
Patient contacted regarding discharge from [facility] on 7/31.  Patient understands to follow up with provider Cecilie Kicks on 8/22 at 11:00 at [facility] Patient understands discharge instructions? yes Patient understands medications and regiment? yes Patient understands to bring all medications to this visit? yes

## 2018-06-30 NOTE — ED Notes (Signed)
Dinner tray ordered.

## 2018-07-01 DIAGNOSIS — R0789 Other chest pain: Secondary | ICD-10-CM | POA: Diagnosis not present

## 2018-07-01 DIAGNOSIS — E119 Type 2 diabetes mellitus without complications: Secondary | ICD-10-CM | POA: Diagnosis not present

## 2018-07-01 DIAGNOSIS — I2510 Atherosclerotic heart disease of native coronary artery without angina pectoris: Secondary | ICD-10-CM | POA: Diagnosis not present

## 2018-07-01 DIAGNOSIS — E7800 Pure hypercholesterolemia, unspecified: Secondary | ICD-10-CM | POA: Diagnosis not present

## 2018-07-01 LAB — TROPONIN I
TROPONIN I: 0.07 ng/mL — AB (ref <0.03)
TROPONIN I: 0.06 ng/mL — AB (ref <0.03)

## 2018-07-01 LAB — GLUCOSE, CAPILLARY
GLUCOSE-CAPILLARY: 160 mg/dL — AB (ref 70–99)
Glucose-Capillary: 171 mg/dL — ABNORMAL HIGH (ref 70–99)

## 2018-07-01 LAB — HIV ANTIBODY (ROUTINE TESTING W REFLEX): HIV SCREEN 4TH GENERATION: NONREACTIVE

## 2018-07-01 MED ORDER — NITROGLYCERIN 0.4 MG SL SUBL: 0.4000 mg | SUBLINGUAL_TABLET | SUBLINGUAL | 2 refills | Status: DC | PRN

## 2018-07-01 NOTE — Progress Notes (Signed)
Subjective:  Feels well overnight.  Troponins have been flat.  No recurrence of chest pain.  Objective:  Vital Signs in the last 24 hours: BP 109/67 (BP Location: Left Arm)   Pulse 82   Temp 98 F (36.7 C) (Oral)   Resp 15   Ht 5\' 6"  (1.676 m)   Wt 88.9 kg (196 lb)   SpO2 98%   BMI 31.64 kg/m   Physical Exam: Male in no acute distress Lungs:  Clear Cardiac:  Regular rhythm, normal S1 and S2, no S3 Extremities:  No edema present  Intake/Output from previous day: 08/02 0701 - 08/03 0700 In: 240 [P.O.:240] Out: 850 [Urine:850]  Weight Filed Weights   06/30/18 1310  Weight: 88.9 kg (196 lb)    Lab Results: Basic Metabolic Panel: Recent Labs    06/30/18 1201  NA 142  K 4.3  CL 104  CO2 24  GLUCOSE 131*  BUN 10  CREATININE 1.06   CBC: Recent Labs    06/30/18 1201  WBC 6.7  HGB 14.4  HCT 43.9  MCV 88.9  PLT 214   Cardiac Enzymes: Troponin (Point of Care Test) Recent Labs    06/30/18 1214  TROPIPOC 0.07   Cardiac Panel (last 3 results) Recent Labs    06/30/18 1702 06/30/18 2328 07/01/18 0454  TROPONINI 0.07* 0.07* 0.06*    Telemetry: Sinus rhythm, personally reviewed  Assessment/Plan:  1.  Somewhat atypical symptoms following recent PCI earlier in the week  2.  CAD with bifurcationopening of diagonal 3.  Hyperlipidemia  Recommendations:  EKG is unchanged and troponins are flat likely related to previous events and I think can go home today to continue follow-up.     Kerry Hough  MD [facility]  07/01/2018, 12:19 PM

## 2018-07-01 NOTE — Progress Notes (Signed)
Discharged to home with family office visits in place teaching done  

## 2018-07-01 NOTE — Discharge Summary (Signed)
Discharge Summary    Patient ID: Brian Rollins,  MRN: [id number], DOB/AGE: 03-May-1956 62 y.o.  Admit date: 06/30/2018 Discharge date: 07/01/2018  Primary Care Provider: Lawerance Cruel Primary Cardiologist: Jenkins Rouge, MD  Discharge Diagnoses    Active Problems:   Chest pain   Allergies Allergies  Allergen Reactions  . Mercury Other (See Comments)    Unknown reaction    Diagnostic Studies/Procedures    Procedures (cath prior to this admission, 06/27/18)  CORONARY STENT INTERVENTION  RIGHT/LEFT HEART CATH AND CORONARY ANGIOGRAPHY  Conclusion     A stent was successfully placed.    Single-vessel disease with complex [place] 1, 1, 1 mid LAD bifurcation 99% stenosis with the large second diagonal 50%.  TIMI grade II flow was noted in the LAD.  The LAD is otherwise normal.  Normal left main  Normal circumflex coronary artery, which is dominant.  Nondominant right coronary.  Successful complex bifurcation PCI reducing 99% LAD stenosis to less than 10% using a 3.5 x 20 mm Onyx postdilated to 4.0 mm at high pressure improving TIMI grade II flow to TIMI grade III.  Post stent deployment the diagonal contains 95% ostial stenosis and was reduced to 70% using kissing balloon technique.  Diagonal flow remained TIMI grade III.  Recommendations:   Aspirin and Brilinta x6 months.  Thereafter would recommend aspirin and clopidogrel for an additional 6 months before dropping clopidogrel.  Aggressive risk factor modification  Recommend uninterrupted dual antiplatelet therapy with Aspirin 81mg  daily and Ticagrelor 90mg  twice daily for a minimum of 12 months (ACS - Class I recommendation).      History of Present Illness     Brian Rollins is a 62 y.o. male with a hx of hypertension, hyperlipidemia, diabetes type 2, OSA, CAD s/p recent stent to mid LAD 06/27/2018 who is being seen today for the evaluation of pain at the request of Dr. Lita Mains.  Mr. Nephew recently  underwent left heart cath for evaluation of exertional chest discomfort.  He was found to have single-vessel disease with 99% stenosis at the mid LAD bifurcation with a large second diagonal 50%.  He underwent successful PCI with drug-eluting stent.  He was discharged on dual antiplatelet therapy with Brilinta and aspirin.  On the morning of 06/30/18 around 9:30, just after having had a soft/semi-loose BM, he was walking around the house and felt lightheaded and then developed a localized soreness in his left chest that he can pinpoint and he says was reproducible earlier when he pushed on it.  It is not reproducible at this time.  He thinks that he may have had some mild shortness of breath as well.  This was not anything like the chest discomfort that he had prior to his stent.  Due to the recent stent placement he was concerned and came to the ED.  Once here he was given 2 sublingual nitroglycerin and his pain resolved shortly after.  It has not returned.  He feels very well right now and feels like he wants to go home.  His physical assessment was normal and he appeared very comfortable.  BMP and CBC were unremarkable.  Chest x-ray  normal.  EKG showed right bundle branch block with T wave inversions that were unchanged from prior.  His troponin was mildly elevated at 0.07, which could be residual after his coronary intervention.  Given his recent PCI, he was admitted for overnight observation and cyclic troponin's.   Hospital Course  Pt was admitted to telemetry. He had no recurrent CP. Troponins remained flat. No EKG changes His VSS. No events overnight. There was no indication for repeat cath. Medical therapy continued. He was last seen and examined by Dr. Wynonia Lawman, who determined he was stable for discharge home. He will f/u with Dr. Johnsie Cancel.   Consultants: none    Discharge Vitals Blood pressure 111/74, pulse 76, temperature 97.8 F (36.6 C), temperature source Oral, resp. rate 14, height  5\' 6"  (1.676 m), weight 196 lb (88.9 kg), SpO2 98 %.  Filed Weights   06/30/18 1310  Weight: 196 lb (88.9 kg)    Labs & Radiologic Studies    CBC Recent Labs    06/30/18 1201  WBC 6.7  HGB 14.4  HCT 43.9  MCV 88.9  PLT 694   Basic Metabolic Panel Recent Labs    06/30/18 1201  NA 142  K 4.3  CL 104  CO2 24  GLUCOSE 131*  BUN 10  CREATININE 1.06  CALCIUM 9.2   Liver Function Tests No results for input(s): AST, ALT, ALKPHOS, BILITOT, PROT, ALBUMIN in the last 72 hours. No results for input(s): LIPASE, AMYLASE in the last 72 hours. Cardiac Enzymes Recent Labs    06/30/18 1702 06/30/18 2328 07/01/18 0454  TROPONINI 0.07* 0.07* 0.06*   BNP Invalid input(s): POCBNP D-Dimer No results for input(s): DDIMER in the last 72 hours. Hemoglobin A1C No results for input(s): HGBA1C in the last 72 hours. Fasting Lipid Panel No results for input(s): CHOL, HDL, LDLCALC, TRIG, CHOLHDL, LDLDIRECT in the last 72 hours. Thyroid Function Tests No results for input(s): TSH, T4TOTAL, T3FREE, THYROIDAB in the last 72 hours.  Invalid input(s): FREET3 _____________  Dg Chest 2 View  Result Date: 06/30/2018 CLINICAL DATA:  62 year old male with 1 hour of chest pain. Recent coronary intervention. Dizziness and shortness of breath. EXAM: CHEST - 2 VIEW COMPARISON:  None. FINDINGS: Lung volumes and mediastinal contours are normal. A coronary artery stent is evident on the lateral view. Visualized tracheal air column is within normal limits. Both lungs are clear. No pneumothorax or pleural effusion. No acute osseous abnormality identified. Negative visible bowel gas pattern. IMPRESSION: No cardiopulmonary abnormality. Electronically Signed   By: Genevie Ann M.D.   On: 06/30/2018 12:36   Disposition   Pt is being discharged home today in good condition.  Follow-up Plans & Appointments    Follow-up Information    Isaiah Serge, NP Follow up on 07/20/2018.   Specialties:  Cardiology,  Radiology Why:  11:00 AM w/ Dr. Kyla Balzarine NP  Contact information: [place] [address] [phone]          Discharge Instructions    Diet - low sodium heart healthy   Complete by:  As directed    Increase activity slowly   Complete by:  As directed       Discharge Medications   Allergies as of 07/01/2018      Reactions   Mercury Other (See Comments)   Unknown reaction      Medication List    STOP taking these medications   ibuprofen 200 MG tablet Commonly known as:  ADVIL,MOTRIN     TAKE these medications   aspirin EC 81 MG tablet Take 81 mg by mouth daily.   benazepril 20 MG tablet Commonly known as:  LOTENSIN Take 20 mg by mouth daily at 6 PM. Takes to protect kidneys   FARXIGA 10 MG Tabs tablet  Generic drug:  dapagliflozin propanediol Take 10 mg by mouth daily.   gabapentin 300 MG capsule Commonly known as:  NEURONTIN Take 300 mg by mouth daily at 6 PM.   glimepiride 2 MG tablet Commonly known as:  AMARYL Take 2 mg by mouth daily before breakfast.   JANUMET XR 50-1000 MG Tb24 Generic drug:  SitaGLIPtin-MetFORMIN HCl Take 1 tablet by mouth 2 (two) times daily.   metoprolol succinate 100 MG 24 hr tablet Commonly known as:  TOPROL-XL TAKE ONE TABLET BY MOUTH DAILY. TAKE WITH OR IMMEDIATELY FOLLOWING MEAL.   nitroGLYCERIN 0.4 MG SL tablet Commonly known as:  NITROSTAT Place 1 tablet (0.4 mg total) under the tongue every 5 (five) minutes as needed for chest pain (no more than 3 doses).   pantoprazole 40 MG tablet Commonly known as:  PROTONIX Take 40 mg by mouth daily.   simvastatin 40 MG tablet Commonly known as:  ZOCOR Take 40 mg by mouth daily at 6 PM.   tamsulosin 0.4 MG Caps capsule Commonly known as:  FLOMAX Take 0.8 mg by mouth daily at 6 PM.   ticagrelor 90 MG Tabs tablet Commonly known as:  BRILINTA Take 1 tablet (90 mg total) by mouth 2 (two) times daily.        Acute coronary syndrome (MI, NSTEMI,  STEMI, etc) this admission?: No.    Outstanding Labs/Studies   None   Duration of Discharge Encounter   Greater than 30 minutes including physician time.  Signed, Lyda Jester, PA-C 07/01/2018, 1:22 PM

## 2018-07-02 ENCOUNTER — Other Ambulatory Visit: Payer: Self-pay | Admitting: Cardiovascular Disease

## 2018-07-07 DIAGNOSIS — M542 Cervicalgia: Secondary | ICD-10-CM | POA: Diagnosis not present

## 2018-07-07 DIAGNOSIS — M5412 Radiculopathy, cervical region: Secondary | ICD-10-CM | POA: Diagnosis not present

## 2018-07-07 DIAGNOSIS — M25511 Pain in right shoulder: Secondary | ICD-10-CM | POA: Diagnosis not present

## 2018-07-10 NOTE — ED Provider Notes (Signed)
[facility] 4E CV SURGICAL PROGRESSIVE CARE Provider Note   CSN: [id number] Arrival date & time: 06/30/18  1134     History   Chief Complaint Chief Complaint  Patient presents with  . Chest Pain    HPI Brian Rollins is a 62 y.o. male.  HPI Patient is 62 year old male who recently had cardiac catheterization with stent placement in the LAD.  He presents with left-sided chest pain and near syncopal episode while walking around the house this morning.  Patient had mild shortness of breath which has improved.  No new lower extremity swelling or pain. Past Medical History:  Diagnosis Date  . Chest discomfort   . Chest pain on exertion   . GERD (gastroesophageal reflux disease)   . High cholesterol   . Hydrocele, left   . Hypertension   . Keratoconus   . OSA (obstructive sleep apnea)    "mild; don't wear mask" (06/27/2018)  . Pneumonia 1980s X 1  . Rapid heart beat   . Type II diabetes mellitus [facility])     Patient Active Problem List   Diagnosis Date Noted  . Chest pain 06/30/2018  . Coronary artery disease involving native coronary artery of native heart with angina pectoris ([place])   . HTN (hypertension) 06/28/2018  . HLD (hyperlipidemia) 06/28/2018  . Status post coronary artery stent placement   . Angina pectoris ([place]) 06/27/2018  . DOE (dyspnea on exertion) 06/27/2018  . DM (diabetes mellitus), type 2 ([place]) 06/27/2018  . Progressive angina ([place]) 06/27/2018    Past Surgical History:  Procedure Laterality Date  . CORONARY ANGIOPLASTY WITH STENT PLACEMENT  06/27/2018  . CORONARY STENT INTERVENTION N/A 06/27/2018   Procedure: CORONARY STENT INTERVENTION;  Surgeon: Belva Crome, MD;  Location: [place] [facility];  Service: Cardiovascular;  Laterality: N/A;  . HYDROCELE EXCISION  12/03/2011   Procedure: HYDROCELECTOMY ADULT;  Surgeon: Claybon Jabs, MD;  Location: [facility];  Service: Urology;  Laterality: Left;  . KNEE ARTHROSCOPY Bilateral 2000s X 2   "removed scar tissue on the right; repaired torn meniscus"  . KNEE ARTHROSCOPY W/ ACL RECONSTRUCTION Right 1990s  . RIGHT/LEFT HEART CATH AND CORONARY ANGIOGRAPHY N/A 06/27/2018   Procedure: RIGHT/LEFT HEART CATH AND CORONARY ANGIOGRAPHY;  Surgeon: Belva Crome, MD;  Location: [place] [facility];  Service: Cardiovascular;  Laterality: N/A;        Home Medications    Prior to Admission medications   Medication Sig Start Date End Date Taking? Authorizing Provider  aspirin EC 81 MG tablet Take 81 mg by mouth daily.   Yes [provider]  benazepril (LOTENSIN) 20 MG tablet Take 20 mg by mouth daily at 6 PM. Takes to protect kidneys   Yes [provider]  dapagliflozin propanediol (FARXIGA) 10 MG TABS tablet Take 10 mg by mouth daily.   Yes [provider]  gabapentin (NEURONTIN) 300 MG capsule Take 300 mg by mouth daily at 6 PM.    Yes [provider]  glimepiride (AMARYL) 2 MG tablet Take 2 mg by mouth daily before breakfast.     Yes [provider]  pantoprazole (PROTONIX) 40 MG tablet Take 40 mg by mouth daily. 03/29/17  Yes [provider]  simvastatin (ZOCOR) 40 MG tablet Take 40 mg by mouth daily at 6 PM.    Yes [provider]  SitaGLIPtin-MetFORMIN HCl (JANUMET XR) 50-1000 MG TB24 Take 1 tablet by mouth 2 (two) times daily.   Yes [provider]  tamsulosin (FLOMAX) 0.4 MG CAPS capsule Take 0.8 mg by mouth daily at 6 PM.    Yes [provider]  ticagrelor (BRILINTA) 90 MG TABS tablet Take 1 tablet (90 mg total) by mouth 2 (two) times daily. 06/28/18  Yes Kroeger, Lorelee Cover., PA-C  metoprolol succinate (TOPROL-XL) 100 MG 24 hr tablet TAKE ONE TABLET BY MOUTH DAILY WITH OR IMMEDIATELY FOLLOWING A MEAL 07/03/18   Josue Hector, MD  nitroGLYCERIN (NITROSTAT) 0.4 MG SL tablet Place 1 tablet (0.4 mg total) under the tongue every 5 (five) minutes as needed for chest pain (no more than 3 doses). 07/01/18   Consuelo Pandy, PA-C    Family History Family History  Adopted: Yes  Family history unknown: Yes    Social History Social History   Tobacco Use  . Smoking status: Former Smoker    Packs/day: 0.50    Years: 3.00    Pack years: 1.50    Types: Cigarettes  . Smokeless tobacco: Former Systems developer  . Tobacco comment: 06/27/2018 "quit smoking & using chew  in the early 1980s"  Substance Use Topics  . Alcohol use: Yes    Alcohol/week: 0.0 standard drinks    Comment: 06/27/2018 "might drink once/month"  . Drug use: Never     Allergies   Mercury   Review of Systems Review of Systems  Constitutional: Negative for chills and fever.  HENT: Negative for trouble swallowing.   Eyes: Negative for visual disturbance.  Respiratory: Positive for shortness of breath. Negative for cough.   Cardiovascular: Positive for chest pain. Negative for palpitations and leg swelling.  Gastrointestinal: Negative for abdominal pain, diarrhea, nausea and vomiting.  Genitourinary: Negative for dysuria and frequency.  Musculoskeletal: Negative for back pain, myalgias, neck pain and neck stiffness.  Skin: Negative for rash and wound.  Neurological: Positive for light-headedness. Negative for weakness, numbness and headaches.  All other systems reviewed and are negative.    Physical Exam Updated Vital Signs BP 111/74 (BP Location: Right Arm)   Pulse 76   Temp 97.8 F (36.6 C) (Oral)   Resp 14   Ht 5\' 6"  (1.676 m)   Wt 88.9 kg   SpO2 98%   BMI 31.64 kg/m   Physical Exam  Constitutional: He is oriented to person, place, and time. He appears well-developed and well-nourished. No distress.  HENT:  Head: Normocephalic and atraumatic.  Mouth/Throat: Oropharynx is clear and moist.  Eyes: Pupils are equal, round, and reactive to light. EOM are normal.  Neck: Normal range of motion. Neck supple.  Cardiovascular: Normal rate and regular rhythm. Exam reveals no gallop and no friction rub.  No murmur  heard. Pulmonary/Chest: Effort normal and breath sounds normal.  Abdominal: Soft. Bowel sounds are normal. There is no tenderness. There is no rebound and no guarding.  Musculoskeletal: Normal range of motion. He exhibits no edema or tenderness.  No lower extremity swelling, asymmetry or tenderness.  No midline thoracic or lumbar tenderness.  No CVA tenderness.  Distal pulses 2+ in all extremities.  Neurological: He is alert and oriented to person, place, and time.  Moving all extremities without focal deficit.  Sensation fully intact.  Skin: Skin is warm and dry. No rash noted. He is not diaphoretic. No erythema.  Psychiatric: He has a normal mood and affect. His behavior is normal.  Nursing note and vitals reviewed.    ED Treatments / Results  Labs (all labs ordered are listed, but only abnormal results are  displayed) Labs Reviewed  BASIC METABOLIC PANEL - Abnormal; Notable for the following components:      Result Value   Glucose, Bld 131 (*)    All other components within normal limits  TROPONIN I - Abnormal; Notable for the following components:   Troponin I 0.07 (*)    All other components within normal limits  TROPONIN I - Abnormal; Notable for the following components:   Troponin I 0.07 (*)    All other components within normal limits  TROPONIN I - Abnormal; Notable for the following components:   Troponin I 0.06 (*)    All other components within normal limits  GLUCOSE, CAPILLARY - Abnormal; Notable for the following components:   Glucose-Capillary 192 (*)    All other components within normal limits  GLUCOSE, CAPILLARY - Abnormal; Notable for the following components:   Glucose-Capillary 171 (*)    All other components within normal limits  GLUCOSE, CAPILLARY - Abnormal; Notable for the following components:   Glucose-Capillary 160 (*)    All other components within normal limits  CBC  HIV ANTIBODY (ROUTINE TESTING)  I-STAT TROPONIN, ED    EKG EKG  Interpretation  Date/Time:  Friday June 30 2018 11:38:21 EDT Ventricular Rate:  76 PR Interval:  128 QRS Duration: 136 QT Interval:  398 QTC Calculation: 447 R Axis:   15 Text Interpretation:  Normal sinus rhythm Right bundle branch block Abnormal ECG Confirmed by Julianne Rice [id number]) on 06/30/2018 12:42:55 PM   Radiology No results found.  Procedures Procedures (including critical care time)  Medications Ordered in ED Medications  aspirin chewable tablet 324 mg (324 mg Oral Given 06/30/18 1308)     Initial Impression / Assessment and Plan / ED Course  I have reviewed the triage vital signs and the nursing notes.  Pertinent labs & imaging results that were available during my care of the patient were reviewed by me and considered in my medical decision making (see chart for details).     Patient chest pain resolved after nitroglycerin.  Mild elevation in troponin.  Discussed with cardiology will see patient in the emergency department and admit.  Final Clinical Impressions(s) / ED Diagnoses   Final diagnoses:  Precordial pain    ED Discharge Orders         Ordered    nitroGLYCERIN (NITROSTAT) 0.4 MG SL tablet  Every 5 min PRN     07/01/18 1321    Increase activity slowly     07/01/18 1321    Diet - low sodium heart healthy     07/01/18 1321           Julianne Rice, MD 07/10/18 1800

## 2018-07-18 DIAGNOSIS — M25511 Pain in right shoulder: Secondary | ICD-10-CM | POA: Diagnosis not present

## 2018-07-18 DIAGNOSIS — M542 Cervicalgia: Secondary | ICD-10-CM | POA: Diagnosis not present

## 2018-07-19 NOTE — Progress Notes (Signed)
Cardiology Office Note   Date:  07/22/2018   ID:  Brian Rollins, DOB 1956/06/09, MRN [id number]  PCP:  Lawerance Cruel, MD  Cardiologist:  Dr. Johnsie Cancel    Chief Complaint  Patient presents with  . Coronary Artery Disease      History of Present Illness: Brian Rollins is a 62 y.o. male who presents for post hospitlaization after stent to LAD. I saw pt 06/26/18 with increasing chest pain with exertion.  He felt his heart was racing as well.  Dr. Harrington Challenger saw him with me and we arranged for cardiac cath the next day.    His hx includes hypertension, hyperlipidemia, diabetes type 2, OSA, CAD, neg stress test in the past and neg holter for arrhythmia.     Pt had cath and found to have 99% LAD stenosis.  Also large second diagonal at 50%.  No other CAD.  He underwent stent to mid LAD 06/27/2018 Post stent deployment the diagonal contains 95% ostial stenosis and was reduced to 70% using kissing balloon technique.  Aspirin and Brilinta x6 months.  Thereafter would recommend aspirin and clopidogrel for an additional 6 months before dropping clopidogrel. Aggressive risk factor modification  He was admitted 06/30/18 for chest pain.  Resolved with 2 NTG sl. Troponin 0.07 flat troponins.   Today he has done well no chest pain and no SOB.  He is having Lt shoulder and neck pain and is having MRI.  He is walking and has returned to work. He is eating healthy. His DM is controlled.  He would like to begin cardiac rehab.  Past Medical History:  Diagnosis Date  . Chest discomfort   . Chest pain on exertion   . GERD (gastroesophageal reflux disease)   . High cholesterol   . Hydrocele, left   . Hypertension   . Keratoconus   . OSA (obstructive sleep apnea)    "mild; don't wear mask" (06/27/2018)  . Pneumonia 1980s X 1  . Rapid heart beat   . Type II diabetes mellitus ([place])     Past Surgical History:  Procedure Laterality Date  . CORONARY ANGIOPLASTY WITH STENT PLACEMENT  06/27/2018  . CORONARY  STENT INTERVENTION N/A 06/27/2018   Procedure: CORONARY STENT INTERVENTION;  Surgeon: Belva Crome, MD;  Location: [facility];  Service: Cardiovascular;  Laterality: N/A;  . HYDROCELE EXCISION  12/03/2011   Procedure: HYDROCELECTOMY ADULT;  Surgeon: Claybon Jabs, MD;  Location[facility];  Service: Urology;  Laterality: Left;  . KNEE ARTHROSCOPY Bilateral 2000s X 2   "removed scar tissue on the right; repaired torn meniscus"  . KNEE ARTHROSCOPY W/ ACL RECONSTRUCTION Right 1990s  . RIGHT/LEFT HEART CATH AND CORONARY ANGIOGRAPHY N/A 06/27/2018   Procedure: RIGHT/LEFT HEART CATH AND CORONARY ANGIOGRAPHY;  Surgeon: Belva Crome, MD;  Location: [facility];  Service: Cardiovascular;  Laterality: N/A;     Current Outpatient Medications  Medication Sig Dispense Refill  . aspirin EC 81 MG tablet Take 81 mg by mouth daily.    . benazepril (LOTENSIN) 20 MG tablet Take 20 mg by mouth daily at 6 PM. Takes to protect kidneys    . dapagliflozin propanediol (FARXIGA) 10 MG TABS tablet Take 10 mg by mouth daily.    Marland Kitchen gabapentin (NEURONTIN) 300 MG capsule Take 300 mg by mouth daily at 6 PM.     . glimepiride (AMARYL) 2 MG tablet Take 2 mg by mouth daily before breakfast.      .  metoprolol succinate (TOPROL-XL) 100 MG 24 hr tablet TAKE ONE TABLET BY MOUTH DAILY WITH OR IMMEDIATELY FOLLOWING A MEAL 30 tablet 11  . nitroGLYCERIN (NITROSTAT) 0.4 MG SL tablet Place 1 tablet (0.4 mg total) under the tongue every 5 (five) minutes as needed for chest pain (no more than 3 doses). 25 tablet 2  . pantoprazole (PROTONIX) 40 MG tablet Take 40 mg by mouth daily.    . simvastatin (ZOCOR) 40 MG tablet Take 40 mg by mouth daily at 6 PM.     . SitaGLIPtin-MetFORMIN HCl (JANUMET XR) 50-1000 MG TB24 Take 1 tablet by mouth 2 (two) times daily.    . tamsulosin (FLOMAX) 0.4 MG CAPS capsule Take 0.8 mg by mouth daily at 6 PM.     . ticagrelor (BRILINTA) 90 MG TABS tablet Take 1 tablet (90 mg total)  by mouth 2 (two) times daily. 180 tablet 3   No current facility-administered medications for this visit.     Allergies:   [place]    Social History:  The patient  reports that he has quit smoking. His smoking use included cigarettes. He has a 1.50 pack-year smoking history. He has quit using smokeless tobacco. He reports that he drinks alcohol. He reports that he does not use drugs.   Family History:  The patient's He was adopted. Family history is unknown by patient.    ROS:  General:no colds or fevers, + weight changes Skin:no rashes or ulcers HEENT:no blurred vision, no congestion CV:see HPI PUL:see HPI GI:no diarrhea constipation or melena, no indigestion GU:no hematuria, no dysuria MS:no joint pain, no claudication Neuro:no syncope, no lightheadedness Endo:+ diabetes, no thyroid disease  Wt Readings from Last 3 Encounters:  07/20/18 190 lb (86.2 kg)  06/30/18 196 lb (88.9 kg)  06/28/18 196 lb 3.4 oz (89 kg)     PHYSICAL EXAM: VS:  BP 122/74   Pulse 74   Ht 5\' 6"  (1.676 m)   Wt 190 lb (86.2 kg)   SpO2 96%   BMI 30.67 kg/m  , BMI Body mass index is 30.67 kg/m. General:Pleasant affect, NAD Skin:Warm and dry, brisk capillary refill HEENT:normocephalic, sclera clear, mucus membranes moist Neck:supple, no JVD, no bruits  Heart:S1S2 RRR without murmur, gallup, rub or click Lungs:clear without rales, rhonchi, or wheezes XAJ:OINO, non tender, + BS, do not palpate liver spleen or masses Ext:no lower ext edema, 2+ pedal pulses, 2+ radial pulses Neuro:alert and oriented X 3, MAE, follows commands, + facial symmetry    EKG:  EKG is ordered today. The ekg ordered today demonstrates SR RBBB, no acute changes   Recent Labs: 06/30/2018: BUN 10; Creatinine, Ser 1.06; Hemoglobin 14.4; Platelets 214; Potassium 4.3; Sodium 142    Lipid Panel    Component Value Date/Time   CHOL 123 06/26/2018 0945   TRIG 221 (H) 06/26/2018 0945   HDL 33 (L) 06/26/2018 0945   CHOLHDL 3.7  06/26/2018 0945   LDLCALC 46 06/26/2018 0945       Other studies Reviewed: Additional studies/ records that were reviewed today include: . Cardiac cath 06/27/18  Single-vessel disease with complex [place] 1, 1, 1 mid LAD bifurcation 99% stenosis with the large second diagonal 50%.  TIMI grade II flow was noted in the LAD.  The LAD is otherwise normal.  Normal left main  Normal circumflex coronary artery, which is dominant.  Nondominant right coronary.  Successful complex bifurcation PCI reducing 99% LAD stenosis to less than 10% using a 3.5 x 20 mm Onyx  postdilated to 4.0 mm at high pressure improving TIMI grade II flow to TIMI grade III.  Post stent deployment the diagonal contains 95% ostial stenosis and was reduced to 70% using kissing balloon technique.  Diagonal flow remained TIMI grade III.  Recommendations:   Aspirin and Brilinta x6 months.  Thereafter would recommend aspirin and clopidogrel for an additional 6 months before dropping clopidogrel.  Aggressive risk factor modification   ASSESSMENT AND PLAN:  1.  CAD with recent Crescendo angina resulting in cath and 99% LAD occlusion.  Underwent PCI with DES and kissing balloon to diag.  Continue asa and brilinta. Ok to proceed with cardiac rehab.   2.   HTN controlled continue BB  3.   HLD on zocor 40 mg continue  4.   DM per PCP.  5.  Shoulder pain per ortho, no surgery with new stent.    Pt would like to change to Dr. Tamala Julian will verrify with Dr. Johnsie Cancel and Dr. Tamala Julian.  He will followup with Dr. Tamala Julian or Johnsie Cancel in 8 weeks.     Current medicines are reviewed with the patient today.  The patient Has no concerns regarding medicines.  The following changes have been made:  See above Labs/ tests ordered today include:see above  Disposition:   FU:  see above  Signed, Cecilie Kicks, NP  07/22/2018 5:44 PM    [facility] [place], [place], [place], [place] Phone: [phone]; Fax: [phone]

## 2018-07-20 ENCOUNTER — Encounter: Payer: Self-pay | Admitting: Cardiology

## 2018-07-20 ENCOUNTER — Ambulatory Visit: Payer: Commercial Managed Care - PPO | Admitting: Cardiology

## 2018-07-20 VITALS — BP 122/74 | HR 74 | Ht 66.0 in | Wt 190.0 lb

## 2018-07-20 DIAGNOSIS — I2510 Atherosclerotic heart disease of native coronary artery without angina pectoris: Secondary | ICD-10-CM

## 2018-07-20 DIAGNOSIS — I10 Essential (primary) hypertension: Secondary | ICD-10-CM | POA: Diagnosis not present

## 2018-07-20 DIAGNOSIS — E785 Hyperlipidemia, unspecified: Secondary | ICD-10-CM

## 2018-07-20 DIAGNOSIS — E119 Type 2 diabetes mellitus without complications: Secondary | ICD-10-CM

## 2018-07-20 DIAGNOSIS — Z95820 Peripheral vascular angioplasty status with implants and grafts: Secondary | ICD-10-CM | POA: Diagnosis not present

## 2018-07-20 NOTE — Patient Instructions (Signed)
Medication Instructions:  Your physician recommends that you continue on your current medications as directed. Please refer to the Current Medication list given to you today.   Labwork: NONE ORDERED TODAY  Testing/Procedures: NONE ORDERED TODAY  Follow-Up: WE WILL CALL YOU WITH AN APPT FOR DR. Tamala Julian   Any Other Special Instructions Will Be Listed Below (If Applicable).     If you need a refill on your cardiac medications before your next appointment, please call your pharmacy.

## 2018-07-22 ENCOUNTER — Encounter: Payer: Self-pay | Admitting: Cardiology

## 2018-07-24 ENCOUNTER — Telehealth (HOSPITAL_COMMUNITY): Payer: Self-pay

## 2018-07-24 NOTE — Telephone Encounter (Signed)
Attempted to contact patient in regards to Cardiac Rehab - lm on vm °

## 2018-07-25 DIAGNOSIS — M542 Cervicalgia: Secondary | ICD-10-CM | POA: Diagnosis not present

## 2018-07-25 DIAGNOSIS — M5412 Radiculopathy, cervical region: Secondary | ICD-10-CM | POA: Diagnosis not present

## 2018-07-25 DIAGNOSIS — M25511 Pain in right shoulder: Secondary | ICD-10-CM | POA: Diagnosis not present

## 2018-07-27 NOTE — Progress Notes (Signed)
I haven't seen him in a year and a half happy to still see him or he can switch to Hank and then back to me when the old man retires [place]

## 2018-07-28 ENCOUNTER — Telehealth: Payer: Self-pay | Admitting: *Deleted

## 2018-07-28 NOTE — Telephone Encounter (Signed)
-----   Message from Isaiah Serge, NP sent at 07/28/2018  8:06 AM EDT ----- They were pretty adamant to change.  I will give them the info but still may need to switch, ok with Lisabeth Pick, please let pt know, that they could see Dr. Tamala Julian but he may retire in next few years or so.   So long term Dr. Johnsie Cancel may be better, but will leave to pt.  Either way appt with MD in 7-8 weeks.  Thanks.  Mickel Baas  ----- Message ----- From: Belva Crome, MD Sent: 07/27/2018   6:03 PM EDT To: Josue Hector, MD, Isaiah Serge, NP  It does not matter to me. He should probably stay put with Nish since he is younger than me! ----- Message ----- From: Isaiah Serge, NP Sent: 07/22/2018   5:59 PM EDT To: Belva Crome, MD  Dr. Tamala Julian, you just did stent to LAD on this man and kissing balloon PTCA to diag.  He would like to change from Dr. Johnsie Cancel to you, I explained that it would be up to both of you.  Just let me know. Thanks.  He is doing well.

## 2018-07-28 NOTE — Telephone Encounter (Signed)
Left message to call back  

## 2018-08-01 ENCOUNTER — Telehealth (HOSPITAL_COMMUNITY): Payer: Self-pay

## 2018-08-01 NOTE — Telephone Encounter (Signed)
Patient returned phone call in regards to Cardiac Rehab. Patient is interested in the Cardiac Rehab Program. Scheduled orientation on 09/12/18 at 7:30am. Patient will attend the 6:45am exc class. Went over insurance with patient, he verbalized understanding. Mailed packet.

## 2018-08-01 NOTE — Telephone Encounter (Signed)
Spoke with pt and he states he would like to proceed with an appt with Dr. Tamala Julian.  Pt understands that he will have to change physicians again when Dr. Francee Piccolo.  Scheduled pt to see Dr. Tamala Julian 09/14/18.  Pt appreciative for assistance.

## 2018-08-01 NOTE — Telephone Encounter (Signed)
° ° °  Please return call to patient °

## 2018-08-01 NOTE — Telephone Encounter (Signed)
Left message to call back  

## 2018-08-07 ENCOUNTER — Ambulatory Visit: Payer: Commercial Managed Care - PPO | Admitting: Cardiology

## 2018-08-14 ENCOUNTER — Telehealth (HOSPITAL_COMMUNITY): Payer: Self-pay

## 2018-08-16 ENCOUNTER — Telehealth (HOSPITAL_COMMUNITY): Payer: Self-pay

## 2018-08-18 DIAGNOSIS — E119 Type 2 diabetes mellitus without complications: Secondary | ICD-10-CM | POA: Diagnosis not present

## 2018-08-18 DIAGNOSIS — Z0000 Encounter for general adult medical examination without abnormal findings: Secondary | ICD-10-CM | POA: Diagnosis not present

## 2018-08-18 DIAGNOSIS — E781 Pure hyperglyceridemia: Secondary | ICD-10-CM | POA: Diagnosis not present

## 2018-08-18 DIAGNOSIS — E782 Mixed hyperlipidemia: Secondary | ICD-10-CM | POA: Diagnosis not present

## 2018-08-21 DIAGNOSIS — Z23 Encounter for immunization: Secondary | ICD-10-CM | POA: Diagnosis not present

## 2018-08-21 DIAGNOSIS — R3911 Hesitancy of micturition: Secondary | ICD-10-CM | POA: Diagnosis not present

## 2018-08-21 DIAGNOSIS — E119 Type 2 diabetes mellitus without complications: Secondary | ICD-10-CM | POA: Diagnosis not present

## 2018-08-21 DIAGNOSIS — Z0000 Encounter for general adult medical examination without abnormal findings: Secondary | ICD-10-CM | POA: Diagnosis not present

## 2018-08-21 DIAGNOSIS — E782 Mixed hyperlipidemia: Secondary | ICD-10-CM | POA: Diagnosis not present

## 2018-08-29 ENCOUNTER — Telehealth (HOSPITAL_COMMUNITY): Payer: Self-pay

## 2018-08-30 DIAGNOSIS — Z6831 Body mass index (BMI) 31.0-31.9, adult: Secondary | ICD-10-CM | POA: Diagnosis not present

## 2018-08-30 DIAGNOSIS — I10 Essential (primary) hypertension: Secondary | ICD-10-CM | POA: Diagnosis not present

## 2018-08-30 DIAGNOSIS — M4722 Other spondylosis with radiculopathy, cervical region: Secondary | ICD-10-CM | POA: Diagnosis not present

## 2018-09-05 ENCOUNTER — Telehealth (HOSPITAL_COMMUNITY): Payer: Self-pay

## 2018-09-05 NOTE — Telephone Encounter (Signed)
**  UPDATED**  Pt insurance is active and benefits verified through [facility]. Co-pay $60.00, DED $0.00/$0.00 met, out of pocket $6,000.00/$457.24 met, co-insurance $0.00. No pre-authorization. [place]/[facility], 09/05/18 @ 2:27PM, REF# [id number]

## 2018-09-06 ENCOUNTER — Telehealth (HOSPITAL_COMMUNITY): Payer: Self-pay | Admitting: Pharmacy Technician

## 2018-09-06 ENCOUNTER — Telehealth: Payer: Self-pay | Admitting: Interventional Cardiology

## 2018-09-06 ENCOUNTER — Telehealth: Payer: Self-pay | Admitting: *Deleted

## 2018-09-06 NOTE — Telephone Encounter (Signed)
Follow up   Janett Billow from [facility] is calling because the pt is wanting to have the surgery on Monday 09/11/18 and wants to see if the clearance can be determined by then

## 2018-09-06 NOTE — Telephone Encounter (Signed)
New Message        Patient is asking if he can come off the "Brilinta" for a replaced disc in his neck?

## 2018-09-06 NOTE — Telephone Encounter (Signed)
   [facility] Pre-operative Risk Assessment    Request for surgical clearance:  1. What type of surgery is being performed?  DECOMPRESSION AND ARTHROPLASTY  OF C4-C5  2. When is this surgery scheduled? TBD   3. What type of clearance is required (medical clearance vs. Pharmacy clearance to hold med vs. Both)? PHARMACY  4. Are there any medications that need to be held prior to surgery and how long?BIRLINTA   5. Practice name and name of physician performing surgery?   [facility]  6.    What is your office phone number: [phone]   3.   What is your office fax number: [phone]  8.   Anesthesia type (None, local, MAC, general) ? GENERAL   Johnedward Brodrick M 09/06/2018, 1:48 PM  _________________________________________________________________   (provider comments below)

## 2018-09-06 NOTE — Telephone Encounter (Signed)
See separate pre-op encounter.

## 2018-09-06 NOTE — Telephone Encounter (Signed)
   Primary Cardiologist: Jenkins Rouge, MD  Chart reviewed as part of pre-operative protocol coverage.   He underwent stent to mid LAD 06/27/2018 - Post stent deployment the diagonal contains 95% ostial stenosis and was reduced to 70% using kissing balloon technique.  Aspirin and Brilinta x6 months. Thereafter would recommend aspirin and clopidogrel for an additional 6 months before dropping clopidogrel.  Patient can not hold Brilinta.   I will route this recommendation to the requesting party via Epic fax function and remove from pre-op pool.  Please call with questions.  [place], [place] 09/06/2018, 3:15 PM

## 2018-09-07 NOTE — Telephone Encounter (Signed)
Cardiac Rehab Medication Review by a Pharmacist  Does the patient feel that his/her medications are working for him/her?  Yes, except no effect from Flomax  Has the patient been experiencing any side effects to the medications prescribed?  no  Does the patient measure his/her own blood pressure or blood glucose at home?  Yes, checks blood glucose daily, readings 100-200 depending on what he eats that day  Does the patient have any problems obtaining medications due to transportation or finances?   no  Understanding of regimen: good Understanding of indications: good Potential of compliance: good  Pharmacist comments: none  Brendolyn Patty, PharmD PGY1 Pharmacy Resident Phone [phone]  09/07/2018   8:20 AM

## 2018-09-11 NOTE — Progress Notes (Signed)
Brian Rollins 62 y.o. male DOB 12/02/1955 MRN [id number]       Nutrition  1. 06/27/2018 Stented coronary artery    Past Medical History:  Diagnosis Date  . Chest discomfort   . Chest pain on exertion   . GERD (gastroesophageal reflux disease)   . High cholesterol   . Hydrocele, left   . Hypertension   . Keratoconus   . OSA (obstructive sleep apnea)    "mild; don't wear mask" (06/27/2018)  . Pneumonia 1980s X 1  . Rapid heart beat   . Type II diabetes mellitus ([place])    Meds reviewed.    Current Outpatient Medications (Endocrine & Metabolic):  .  dapagliflozin propanediol (FARXIGA) 10 MG TABS tablet, Take 10 mg by mouth daily. Marland Kitchen  glimepiride (AMARYL) 2 MG tablet, Take 2 mg by mouth daily before breakfast.   .  SitaGLIPtin-MetFORMIN HCl (JANUMET XR) 50-1000 MG TB24, Take 1 tablet by mouth 2 (two) times daily.  Current Outpatient Medications (Cardiovascular):  .  benazepril (LOTENSIN) 20 MG tablet, Take 20 mg by mouth daily at 6 PM. Takes to protect kidneys .  metoprolol succinate (TOPROL-XL) 100 MG 24 hr tablet, TAKE ONE TABLET BY MOUTH DAILY WITH OR IMMEDIATELY FOLLOWING A MEAL .  nitroGLYCERIN (NITROSTAT) 0.4 MG SL tablet, Place 1 tablet (0.4 mg total) under the tongue every 5 (five) minutes as needed for chest pain (no more than 3 doses). (Patient not taking: Reported on 09/07/2018) .  simvastatin (ZOCOR) 40 MG tablet, Take 40 mg by mouth daily at 6 PM.    Current Outpatient Medications (Analgesics):  .  aspirin EC 81 MG tablet, Take 81 mg by mouth daily.  Current Outpatient Medications (Hematological):  .  ticagrelor (BRILINTA) 90 MG TABS tablet, Take 1 tablet (90 mg total) by mouth 2 (two) times daily.  Current Outpatient Medications (Other):  .  gabapentin (NEURONTIN) 300 MG capsule, Take 300 mg by mouth daily at 6 PM.  .  methocarbamol (ROBAXIN) 500 MG tablet, Take 500 mg by mouth every 8 (eight) hours as needed for muscle spasms. .  pantoprazole (PROTONIX) 40 MG  tablet, Take 40 mg by mouth daily. .  tamsulosin (FLOMAX) 0.4 MG CAPS capsule, Take 0.8 mg by mouth daily at 6 PM.    HT: Ht Readings from Last 1 Encounters:  07/20/18 5\' 6"  (1.676 m)    WT: Wt Readings from Last 5 Encounters:  07/20/18 190 lb (86.2 kg)  06/30/18 196 lb (88.9 kg)  06/28/18 196 lb 3.4 oz (89 kg)  06/26/18 192 lb 12.8 oz (87.5 kg)  04/18/17 190 lb (86.2 kg)      BMI= 30.68 (07/20/18)   Current tobacco use? No       Labs:  Lipid Panel     Component Value Date/Time   CHOL 123 06/26/2018 0945   TRIG 221 (H) 06/26/2018 0945   HDL 33 (L) 06/26/2018 0945   CHOLHDL 3.7 06/26/2018 0945   LDLCALC 46 06/26/2018 0945    No results found for: HGBA1C CBG (last 3)  No results for input(s): GLUCAP in the last 72 hours.  Nutrition Diagnosis ? Food-and nutrition-related knowledge deficit related to lack of exposure to information as related to diagnosis of: ? CVD ? Type 2 Diabetes ? Obese  I = 30-34.9 related to excessive energy intake as evidenced by a BMI 30.68  Nutrition Goal(s):  ? To be determined  Plan:  Pt to attend nutrition classes ? Nutrition I ? Nutrition II ?  Portion Distortion  ? Diabetes Blitz ? Diabetes Q & A Will provide client-centered nutrition education as part of interdisciplinary care.   Monitor and evaluate progress toward nutrition goal with team.  Laurina Bustle, MS, RD, LDN 09/11/2018 10:10 AM

## 2018-09-12 ENCOUNTER — Encounter (HOSPITAL_COMMUNITY)
Admission: RE | Admit: 2018-09-12 | Discharge: 2018-09-12 | Disposition: A | Discharge status: Home / Self Care | Payer: Commercial Managed Care - PPO | Source: Ambulatory Visit | Attending: Cardiovascular Disease | Admitting: Cardiovascular Disease

## 2018-09-12 ENCOUNTER — Encounter (HOSPITAL_COMMUNITY): Payer: Self-pay

## 2018-09-12 VITALS — BP 128/68 | HR 72 | Ht 66.0 in | Wt 191.6 lb

## 2018-09-12 DIAGNOSIS — Z955 Presence of coronary angioplasty implant and graft: Secondary | ICD-10-CM | POA: Insufficient documentation

## 2018-09-12 NOTE — Progress Notes (Signed)
Cardiac Individual Treatment Plan  Patient Details  Name: Brian Rollins MRN: [id number] Date of Birth: Oct 04, 1956 Referring Provider:   Flowsheet Row CARDIAC REHAB PHASE II ORIENTATION from 09/12/2018 in [place]  Referring Provider  Brian Rouge, MD      Initial Encounter Date:  Brian Rollins PHASE II ORIENTATION from 09/12/2018 in [place]  Date  09/12/18      Visit Diagnosis: 06/27/2018 Stented coronary artery  Patient's Home Medications on Admission:  Current Outpatient Medications:  .  aspirin EC 81 MG tablet, Take 81 mg by mouth daily., Disp: , Rfl:  .  benazepril (LOTENSIN) 20 MG tablet, Take 20 mg by mouth daily at 6 PM. Takes to protect kidneys, Disp: , Rfl:  .  dapagliflozin propanediol (FARXIGA) 10 MG TABS tablet, Take 10 mg by mouth daily., Disp: , Rfl:  .  gabapentin (NEURONTIN) 300 MG capsule, Take 300 mg by mouth daily at 6 PM. , Disp: , Rfl:  .  methocarbamol (ROBAXIN) 500 MG tablet, Take 500 mg by mouth every 8 (eight) hours as needed for muscle spasms., Disp: , Rfl:  .  metoprolol succinate (TOPROL-XL) 100 MG 24 hr tablet, TAKE ONE TABLET BY MOUTH DAILY WITH OR IMMEDIATELY FOLLOWING A MEAL, Disp: 30 tablet, Rfl: 11 .  nitroGLYCERIN (NITROSTAT) 0.4 MG SL tablet, Place 1 tablet (0.4 mg total) under the tongue every 5 (five) minutes as needed for chest pain (no more than 3 doses)., Disp: 25 tablet, Rfl: 2 .  pantoprazole (PROTONIX) 40 MG tablet, Take 40 mg by mouth daily., Disp: , Rfl:  .  simvastatin (ZOCOR) 40 MG tablet, Take 40 mg by mouth daily at 6 PM. , Disp: , Rfl:  .  SitaGLIPtin-MetFORMIN HCl (JANUMET XR) 50-1000 MG TB24, Take 1 tablet by mouth 2 (two) times daily., Disp: , Rfl:  .  tamsulosin (FLOMAX) 0.4 MG CAPS capsule, Take 0.8 mg by mouth daily at 6 PM. , Disp: , Rfl:  .  ticagrelor (BRILINTA) 90 MG TABS tablet, Take 1 tablet (90 mg total) by mouth 2 (two) times daily.,  Disp: 180 tablet, Rfl: 3  Past Medical History: Past Medical History:  Diagnosis Date  . Chest discomfort   . Chest pain on exertion   . GERD (gastroesophageal reflux disease)   . High cholesterol   . Hydrocele, left   . Hypertension   . Keratoconus   . OSA (obstructive sleep apnea)    "mild; don't wear mask" (06/27/2018)  . Pneumonia 1980s X 1  . Rapid heart beat   . Type II diabetes mellitus (HCC)     Tobacco Use: Social History   Tobacco Use  Smoking Status Former Smoker  . Packs/day: 0.50  . Years: 3.00  . Pack years: 1.50  . Types: Cigarettes  Smokeless Tobacco Former User  Tobacco Comment   06/27/2018 "quit smoking & using chew  in the early 1980s"    Labs: Recent Review Flowsheet Data    Labs for ITP Cardiac and Pulmonary Rehab Latest Ref Rng & Units 06/26/2018 06/27/2018 06/27/2018   Cholestrol 100 - 199 mg/dL 123 - -   LDLCALC 0 - 99 mg/dL 46 - -   HDL >39 mg/dL 33(L) - -   Trlycerides 0 - 149 mg/dL 221(H) - -   PHART 7.350 - 7.450 - - 7.313(L)   PCO2ART 32.0 - 48.0 mmHg - - 46.1   HCO3 20.0 - 28.0 mmol/L - 24.3 23.4  TCO2 22 - 32 mmol/L - 26 25   ACIDBASEDEF 0.0 - 2.0 mmol/L - 2.0 3.0(H)   O2SAT % - 65.0 90.0      Capillary Blood Glucose: Lab Results  Component Value Date   GLUCAP 160 (H) 07/01/2018   GLUCAP 171 (H) 07/01/2018   GLUCAP 192 (H) 06/30/2018   GLUCAP 139 (H) 06/28/2018   GLUCAP 160 (H) 06/27/2018     Exercise Target Goals: Exercise Program Goal: Individual exercise prescription set using results from initial 6 min walk test and THRR while considering  patient's activity barriers and safety.   Exercise Prescription Goal: Initial exercise prescription builds to 30-45 minutes a day of aerobic activity, 2-3 days per week.  Home exercise guidelines will be given to patient during program as part of exercise prescription that the participant will acknowledge.  Activity Barriers & Risk Stratification: Activity Barriers & Cardiac Risk  Stratification - 09/12/18 0915    Activity Barriers & Cardiac Risk Stratification          Activity Barriers  --    Cardiac Risk Stratification  --           6 Minute Walk: 6 Minute Walk    6 Minute Walk    Row Name 09/12/18 0901   Phase  Initial   Distance  1546 feet   Walk Time  6 minutes   # of Rest Breaks  0   MPH  2.93   METS  3.39   RPE  9   Symptoms  No   Resting HR  72 bpm   Resting BP  128/68   Resting Oxygen Saturation   97 %   Exercise Oxygen Saturation  during 6 min walk  97 %   Max Ex. HR  82 bpm   Max Ex. BP  124/70   2 Minute Post BP  120/70          Oxygen Initial Assessment:   Oxygen Re-Evaluation:   Oxygen Discharge (Final Oxygen Re-Evaluation):   Initial Exercise Prescription: Initial Exercise Prescription - 09/12/18 0900    Date of Initial Exercise RX and Referring Provider          Date  09/12/18    Referring Provider  Brian Rouge, MD    Expected Discharge Date  12/18/18        Bike          Level  1.1    Minutes  10    METs  3.44        NuStep          Level  3    SPM  85    Minutes  10    METs  3        Track          Laps  14    Minutes  10    METs  3.44        Prescription Details          Frequency (times per week)  3    Duration  Progress to 30 minutes of continuous aerobic without signs/symptoms of physical distress        Intensity          THRR 40-80% of Max Heartrate  63-126    Ratings of Perceived Exertion  11-13    Perceived Dyspnea  0-4        Progression          Progression  Continue to  progress workloads to maintain intensity without signs/symptoms of physical distress.        Resistance Training          Training Prescription  Yes    Weight  4lbs    Reps  10-15           Perform Capillary Blood Glucose checks as needed.  Exercise Prescription Changes:   Exercise Comments:   Exercise Goals and Review: Exercise Goals    Exercise Goals    Row Name 09/12/18 0943    Increase Physical Activity  Yes   Intervention  Provide advice, education, support and counseling about physical activity/exercise needs.;Develop an individualized exercise prescription for aerobic and resistive training based on initial evaluation findings, risk stratification, comorbidities and participant's personal goals.   Expected Outcomes  Short Term: Attend rehab on a regular basis to increase amount of physical activity.;Long Term: Exercising regularly at least 3-5 days a week.;Long Term: Add in home exercise to make exercise part of routine and to increase amount of physical activity.   Increase Strength and Stamina  Yes   Intervention  Provide advice, education, support and counseling about physical activity/exercise needs.;Develop an individualized exercise prescription for aerobic and resistive training based on initial evaluation findings, risk stratification, comorbidities and participant's personal goals.   Expected Outcomes  Short Term: Increase workloads from initial exercise prescription for resistance, speed, and METs.;Short Term: Perform resistance training exercises routinely during rehab and add in resistance training at home;Long Term: Improve cardiorespiratory fitness, muscular endurance and strength as measured by increased METs and functional capacity (6MWT)   Able to understand and use rate of perceived exertion (RPE) scale  Yes   Intervention  Provide education and explanation on how to use RPE scale   Expected Outcomes  Short Term: Able to use RPE daily in rehab to express subjective intensity level;Long Term:  Able to use RPE to guide intensity level when exercising independently   Knowledge and understanding of Target Heart Rate Range (THRR)  Yes   Intervention  Provide education and explanation of THRR including how the numbers were predicted and where they are located for reference   Expected Outcomes  Short Term: Able to state/look up THRR;Long Term: Able to use THRR to  govern intensity when exercising independently;Short Term: Able to use daily as guideline for intensity in rehab   Able to check pulse independently  Yes   Intervention  Provide education and demonstration on how to check pulse in carotid and radial arteries.;Review the importance of being able to check your own pulse for safety during independent exercise   Expected Outcomes  Short Term: Able to explain why pulse checking is important during independent exercise;Long Term: Able to check pulse independently and accurately   Understanding of Exercise Prescription  Yes   Intervention  Provide education, explanation, and written materials on patient's individual exercise prescription   Expected Outcomes  Short Term: Able to explain program exercise prescription;Long Term: Able to explain home exercise prescription to exercise independently          Exercise Goals Re-Evaluation :   Discharge Exercise Prescription (Final Exercise Prescription Changes):   Nutrition:  Target Goals: Understanding of nutrition guidelines, daily intake of sodium 1500mg , cholesterol 200mg , calories 30% from fat and 7% or less from saturated fats, daily to have 5 or more servings of fruits and vegetables.  Biometrics: Pre Biometrics - 09/12/18 0907    Pre Biometrics          Height  5\' 6"  (1.676 m)    Weight  86.9 kg    Waist Circumference  43 inches    Hip Circumference  41 inches    Waist to Hip Ratio  1.05 %    BMI (Calculated)  30.94    Triceps Skinfold  24 mm    % Body Fat  32.1 %    Grip Strength  44 kg    Flexibility  16 in    Single Leg Stand  15 seconds            Nutrition Therapy Plan and Nutrition Goals:   Nutrition Assessments:   Nutrition Goals Re-Evaluation:   Nutrition Goals Re-Evaluation:   Nutrition Goals Discharge (Final Nutrition Goals Re-Evaluation):   Psychosocial: Target Goals: Acknowledge presence or absence of significant depression and/or stress, maximize  coping skills, provide positive support system. Participant is able to verbalize types and ability to use techniques and skills needed for reducing stress and depression.  Initial Review & Psychosocial Screening: Initial Psych Review & Screening - 09/12/18 1025    Initial Review          Current issues with  Current Stress Concerns    Source of Stress Concerns  Unable to participate in former interests or hobbies;Chronic Illness   CAD, DM,HTN, [place]?  Yes   wife, coworker        Barriers          Psychosocial barriers to participate in program  There are no identifiable barriers or psychosocial needs.        Screening Interventions          Interventions  Encouraged to exercise;Provide feedback about the scores to participant    Expected Outcomes  Short Term goal: Utilizing psychosocial counselor, staff and physician to assist with identification of specific Stressors or current issues interfering with healing process. Setting desired goal for each stressor or current issue identified.;Long Term Goal: Stressors or current issues are controlled or eliminated.;Short Term goal: Identification and review with participant of any Quality of Life or Depression concerns found by scoring the questionnaire.;Long Term goal: The participant improves quality of Life and PHQ9 Scores as seen by post scores and/or verbalization of changes           Quality of Life Scores: Quality of Life - 09/12/18 0911    Quality of Life          Select  Quality of Life        Quality of Life Scores          Health/Function Pre  19 %    Socioeconomic Pre  20.6 %    Psych/Spiritual Pre  14.7 %    Family Pre  17.4 %    GLOBAL Pre  18.3 %          Scores of 19 and below usually indicate a poorer quality of life in these areas.  A difference of  2-3 points is a clinically meaningful difference.  A difference of 2-3 points in the total score of the Quality  of Life Index has been associated with significant improvement in overall quality of life, self-image, physical symptoms, and general health in studies assessing change in quality of life.  PHQ-9: Recent Review Flowsheet Data    There is no flowsheet data to display.     Interpretation of Total Score  Total  Score Depression Severity:  1-4 = Minimal depression, 5-9 = Mild depression, 10-14 = Moderate depression, 15-19 = Moderately severe depression, 20-27 = Severe depression   Psychosocial Evaluation and Intervention:   Psychosocial Re-Evaluation:   Psychosocial Discharge (Final Psychosocial Re-Evaluation):   Vocational Rehabilitation: Provide vocational rehab assistance to qualifying candidates.   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 Participant will state understanding/return demonstration of topics presented.  Learning Barriers/Preferences: Learning Barriers/Preferences - 09/12/18 0912    Learning Barriers/Preferences          Learning Barriers  Sight    Learning Preferences  Individual Instruction;Skilled Demonstration           Education Topics: Count Your Pulse:  -Group instruction provided by verbal instruction, demonstration, patient participation and written materials to support subject.  Instructors address importance of being able to find your pulse and how to count your pulse when at home without a heart monitor.  Patients get hands on experience counting their pulse with staff help and individually.   Heart Attack, Angina, and Risk Factor Modification:  -Group instruction provided by verbal instruction, video, and written materials to support subject.   Instructors address signs and symptoms of angina and heart attacks.    Also discuss risk factors for heart disease and how to make changes to improve heart health risk factors.   Functional Fitness:  -Group instruction provided by verbal instruction, demonstration, patient participation, and written materials to support subject.  Instructors address safety measures for doing things around the house.  Discuss how to get up and down off the floor, how to pick things up properly, how to safely get out of a chair without assistance, and balance training.   Meditation and Mindfulness:  -Group instruction provided by verbal instruction, patient participation, and written materials to support subject.  Instructor addresses importance of mindfulness and meditation practice to help reduce stress and improve awareness.  Instructor also leads participants through a meditation exercise.    Stretching for Flexibility and Mobility:  -Group instruction provided by verbal instruction, patient participation, and written materials to support subject.  Instructors lead participants through series of stretches that are designed to increase flexibility thus improving mobility.  These stretches are additional exercise for major muscle groups that are typically performed during regular warm up and cool down.   Hands Only CPR:  -Group verbal, video, and participation provides a basic overview of AHA guidelines for community CPR. Role-play of emergencies allow participants the opportunity to practice calling for help and chest compression technique with discussion of AED use.   Hypertension: -Group verbal and written instruction that provides a basic overview of hypertension including the most recent diagnostic guidelines, risk factor reduction with self-care instructions and medication management.    Nutrition I class: Heart Healthy Eating:  -Group instruction provided by PowerPoint slides, verbal discussion, and  written materials to support subject matter. The instructor gives an explanation and review of the Therapeutic Lifestyle Changes diet recommendations, which includes a discussion on lipid goals, dietary fat, sodium, fiber, plant stanol/sterol esters, sugar, and the components of a well-balanced, healthy diet.   Nutrition II class: Lifestyle Skills:  -Group  instruction provided by PowerPoint slides, verbal discussion, and written materials to support subject matter. The instructor gives an explanation and review of label reading, grocery shopping for heart health, heart healthy recipe modifications, and ways to make healthier choices when eating out.   Diabetes Question & Answer:  -Group instruction provided by PowerPoint slides, verbal discussion, and written materials to support subject matter. The instructor gives an explanation and review of diabetes co-morbidities, pre- and post-prandial blood glucose goals, pre-exercise blood glucose goals, signs, symptoms, and treatment of hypoglycemia and hyperglycemia, and foot care basics.   Diabetes Blitz:  -Group instruction provided by PowerPoint slides, verbal discussion, and written materials to support subject matter. The instructor gives an explanation and review of the physiology behind type 1 and type 2 diabetes, diabetes medications and rational behind using different medications, pre- and post-prandial blood glucose recommendations and Hemoglobin A1c goals, diabetes diet, and exercise including blood glucose guidelines for exercising safely.    Portion Distortion:  -Group instruction provided by PowerPoint slides, verbal discussion, written materials, and food models to support subject matter. The instructor gives an explanation of serving size versus portion size, changes in portions sizes over the last 20 years, and what consists of a serving from each food group.   Stress Management:  -Group instruction provided by verbal instruction,  video, and written materials to support subject matter.  Instructors review role of stress in heart disease and how to cope with stress positively.     Exercising on Your Own:  -Group instruction provided by verbal instruction, power point, and written materials to support subject.  Instructors discuss benefits of exercise, components of exercise, frequency and intensity of exercise, and end points for exercise.  Also discuss use of nitroglycerin and activating EMS.  Review options of places to exercise outside of rehab.  Review guidelines for sex with heart disease.   Cardiac Drugs I:  -Group instruction provided by verbal instruction and written materials to support subject.  Instructor reviews cardiac drug classes: antiplatelets, anticoagulants, beta blockers, and statins.  Instructor discusses reasons, side effects, and lifestyle considerations for each drug class.   Cardiac Drugs II:  -Group instruction provided by verbal instruction and written materials to support subject.  Instructor reviews cardiac drug classes: angiotensin converting enzyme inhibitors (ACE-I), angiotensin II receptor blockers (ARBs), nitrates, and calcium channel blockers.  Instructor discusses reasons, side effects, and lifestyle considerations for each drug class.   Anatomy and Physiology of the Circulatory System:  Group verbal and written instruction and models provide basic cardiac anatomy and physiology, with the coronary electrical and arterial systems. Review of: AMI, Angina, Valve disease, Heart Failure, Peripheral Artery Disease, Cardiac Arrhythmia, Pacemakers, and the ICD.   Other Education:  -Group or individual verbal, written, or video instructions that support the educational goals of the cardiac rehab program.   Holiday Eating Survival Tips:  -Group instruction provided by PowerPoint slides, verbal discussion, and written materials to support subject matter. The instructor gives patients tips,  tricks, and techniques to help them not only survive but enjoy the holidays despite the onslaught of food that accompanies the holidays.   Knowledge Questionnaire Score: Knowledge Questionnaire Score - 09/12/18 0924    Knowledge Questionnaire Score          Pre Score  21/24           Core Components/Risk Factors/Patient Goals at Admission: Personal Goals and Risk Factors at Admission - 09/12/18 0921    Core Components/Risk Factors/Patient Goals on Admission  Weight Management  Yes;Weight Loss;Obesity    Intervention  Weight Management: Develop a combined nutrition and exercise program designed to reach desired caloric intake, while maintaining appropriate intake of nutrient and fiber, sodium and fats, and appropriate energy expenditure required for the weight goal.;Weight Management: Provide education and appropriate resources to help participant work on and attain dietary goals.;Weight Management/Obesity: Establish reasonable short term and long term weight goals.    Admit Weight  191 lb 9.3 oz (86.9 kg)    Expected Outcomes  Short Term: Continue to assess and modify interventions until short term weight is achieved;Long Term: Adherence to nutrition and physical activity/exercise program aimed toward attainment of established weight goal;Weight Loss: Understanding of general recommendations for a balanced deficit meal plan, which promotes 1-2 lb weight loss per week and includes a negative energy balance of [phone] kcal/d;Understanding recommendations for meals to include 15-35% energy as protein, 25-35% energy from fat, 35-60% energy from carbohydrates, less than 200mg  of dietary cholesterol, 20-35 gm of total fiber daily;Understanding of distribution of calorie intake throughout the day with the consumption of 4-5 meals/snacks    Diabetes  Yes    Intervention  Provide education about signs/symptoms and action to take for hypo/hyperglycemia.;Provide education about proper nutrition,  including hydration, and aerobic/resistive exercise prescription along with prescribed medications to achieve blood glucose in normal ranges: Fasting glucose 65-99 mg/dL    Expected Outcomes  Short Term: Participant verbalizes understanding of the signs/symptoms and immediate care of hyper/hypoglycemia, proper foot care and importance of medication, aerobic/resistive exercise and nutrition plan for blood glucose control.;Long Term: Attainment of HbA1C < 7%.    Hypertension  Yes    Intervention  Provide education on lifestyle modifcations including regular physical activity/exercise, weight management, moderate sodium restriction and increased consumption of fresh fruit, vegetables, and low fat dairy, alcohol moderation, and smoking cessation.;Monitor prescription use compliance.    Expected Outcomes  Short Term: Continued assessment and intervention until BP is < 140/56mm HG in hypertensive participants. < 130/24mm HG in hypertensive participants with diabetes, heart failure or chronic kidney disease.;Long Term: Maintenance of blood pressure at goal levels.    Lipids  Yes    Intervention  Provide education and support for participant on nutrition & aerobic/resistive exercise along with prescribed medications to achieve LDL 70mg , HDL >40mg .    Expected Outcomes  Short Term: Participant states understanding of desired cholesterol values and is compliant with medications prescribed. Participant is following exercise prescription and nutrition guidelines.;Long Term: Cholesterol controlled with medications as prescribed, with individualized exercise RX and with personalized nutrition plan. Value goals: LDL < 70mg , HDL > 40 mg.    Stress  Yes    Intervention  Offer individual and/or small group education and counseling on adjustment to heart disease, stress management and health-related lifestyle change. Teach and support self-help strategies.;Refer participants experiencing significant psychosocial distress to  appropriate mental health specialists for further evaluation and treatment. When possible, include family members and significant others in education/counseling sessions.    Expected Outcomes  Short Term: Participant demonstrates changes in health-related behavior, relaxation and other stress management skills, ability to obtain effective social support, and compliance with psychotropic medications if prescribed.;Long Term: Emotional wellbeing is indicated by absence of clinically significant psychosocial distress or social isolation.           Core Components/Risk Factors/Patient Goals Review:    Core Components/Risk Factors/Patient Goals at Discharge (Final Review):    ITP Comments: ITP Comments    Row Name 09/08/18 1125  ITP Comments  Dr. Fransico Him, Medical Director       Comments: Patient attended orientation from [phone]  to [phone] to review rules and guidelines for program. Completed 6 minute walk test, Intitial ITP, and exercise prescription.  VSS. Telemetry-sinus rhythm, PACs,   Asymptomatic.  Andi Hence, RN, BSN Cardiac Pulmonary Rehab 09/12/18 10:34 AM

## 2018-09-13 DIAGNOSIS — Z955 Presence of coronary angioplasty implant and graft: Secondary | ICD-10-CM | POA: Diagnosis not present

## 2018-09-13 NOTE — Progress Notes (Signed)
Cardiology Office Note:    Date:  09/14/2018   ID:  Brian Rollins, DOB 07-10-1956, MRN [id number]  PCP:  Brian Cruel, MD  Cardiologist:  Brian Rouge, MD   Referring MD: Brian Cruel, MD   Chief Complaint  Patient presents with  . Coronary Artery Disease    History of Present Illness:    Brian Rollins is a 62 y.o. male with a hx of coronary artery disease, coronary PCI, hyperlipidemia, hypertension, obstructive sleep apnea, and type 2 diabetes mellitus.  History of tachycardia.  [name] is doing well.  He has not had any recurrent angina.  He is physically active without noticeable limitations.  He has not needed nitroglycerin.  No medication side effects.  No peripheral edema.  He has not had prolonged palpitations.  Past Medical History:  Diagnosis Date  . Chest discomfort   . Chest pain on exertion   . GERD (gastroesophageal reflux disease)   . High cholesterol   . Hydrocele, left   . Hypertension   . Keratoconus   . OSA (obstructive sleep apnea)    "mild; don't wear mask" (06/27/2018)  . Pneumonia 1980s X 1  . Rapid heart beat   . Type II diabetes mellitus ([place])     Past Surgical History:  Procedure Laterality Date  . CORONARY ANGIOPLASTY WITH STENT PLACEMENT  06/27/2018  . CORONARY STENT INTERVENTION N/A 06/27/2018   Procedure: CORONARY STENT INTERVENTION;  Surgeon: Brian Crome, MD;  Location: [facility];  Service: Cardiovascular;  Laterality: N/A;  . HYDROCELE EXCISION  12/03/2011   Procedure: HYDROCELECTOMY ADULT;  Surgeon: Brian Jabs, MD;  Location: [facility];  Service: Urology;  Laterality: Left;  . KNEE ARTHROSCOPY Bilateral 2000s X 2   "removed scar tissue on the right; repaired torn meniscus"  . KNEE ARTHROSCOPY W/ ACL RECONSTRUCTION Right 1990s  . RIGHT/LEFT HEART CATH AND CORONARY ANGIOGRAPHY N/A 06/27/2018   Procedure: RIGHT/LEFT HEART CATH AND CORONARY ANGIOGRAPHY;  Surgeon: Brian Crome, MD;  Location: [facility];  Service: Cardiovascular;  Laterality: N/A;    Current Medications: Current Meds  Medication Sig  . aspirin EC 81 MG tablet Take 81 mg by mouth daily.  . benazepril (LOTENSIN) 20 MG tablet Take 20 mg by mouth daily at 6 PM. Takes to protect kidneys  . dapagliflozin propanediol (FARXIGA) 10 MG TABS tablet Take 10 mg by mouth daily.  Marland Kitchen gabapentin (NEURONTIN) 300 MG capsule Take 300 mg by mouth daily at 6 PM.   . metoprolol succinate (TOPROL-XL) 100 MG 24 hr tablet TAKE ONE TABLET BY MOUTH DAILY WITH OR IMMEDIATELY FOLLOWING A MEAL  . nitroGLYCERIN (NITROSTAT) 0.4 MG SL tablet Place 1 tablet (0.4 mg total) under the tongue every 5 (five) minutes as needed for chest pain (no more than 3 doses).  . pantoprazole (PROTONIX) 40 MG tablet Take 40 mg by mouth daily.  . simvastatin (ZOCOR) 40 MG tablet Take 40 mg by mouth daily at 6 PM.   . SitaGLIPtin-MetFORMIN HCl (JANUMET XR) 50-1000 MG TB24 Take 1 tablet by mouth 2 (two) times daily.  . tamsulosin (FLOMAX) 0.4 MG CAPS capsule Take 0.8 mg by mouth daily at 6 PM.   . ticagrelor (BRILINTA) 90 MG TABS tablet Take 1 tablet (90 mg total) by mouth 2 (two) times daily.     Allergies:   Mercury   Social History   Socioeconomic History  . Marital status: Married    Spouse name: Brian Rollins  .  Number of children: 2  . Years of education: college  . Highest education level: Not on file  Occupational History  . 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 Financial resource strain: Not on file  . Food insecurity:    Worry: Not on file    Inability: Not on file  . Transportation needs:    Medical: Not on file    Non-medical: Not on file  Tobacco Use  . Smoking status: Former Smoker    Packs/day: 0.50    Years: 3.00    Pack years: 1.50    Types: Cigarettes  . Smokeless tobacco: Former Systems developer  . Tobacco comment: 06/27/2018 "quit smoking & using chew  in the early 1980s"  Substance and Sexual Activity  . Alcohol use: Yes     Alcohol/week: 0.0 standard drinks    Comment: 06/27/2018 "might drink once/month"  . Drug use: Never  . Sexual activity: Not Currently  Lifestyle  . Physical activity:    Days per week: Not on file    Minutes per session: Not on file  . Stress: Not on file  Relationships  . Social connections:    Talks on phone: Not on file    Gets together: Not on file    Attends religious service: Not on file    Active member of club or organization: Not on file    Attends meetings of clubs or organizations: Not on file    Relationship status: Not on file  Other Topics Concern  . Not on file  Social History Narrative  . Not on file     Family History: The patient's He was adopted. Family history is unknown by patient.  ROS:   Please see the history of present illness.    No bleeding.  Right shoulder and neck discomfort.  Cervical disc disease needs to be repaired by Dr. Ellene Rollins.  All other systems reviewed and are negative.  EKGs/Labs/Other Studies Reviewed:    The following studies were reviewed today: No new data  EKG:  EKG is not ordered today.    Recent Labs: 06/30/2018: BUN 10; Creatinine, Ser 1.06; Hemoglobin 14.4; Platelets 214; Potassium 4.3; Sodium 142  Recent Lipid Panel    Component Value Date/Time   CHOL 123 06/26/2018 0945   TRIG 221 (H) 06/26/2018 0945   HDL 33 (L) 06/26/2018 0945   CHOLHDL 3.7 06/26/2018 0945   LDLCALC 46 06/26/2018 0945    Physical Exam:    VS:  BP 118/80   Pulse 77   Ht 5\' 5"  (1.651 m)   Wt 192 lb (87.1 kg)   BMI 31.95 kg/m     Wt Readings from Last 3 Encounters:  09/14/18 192 lb (87.1 kg)  09/12/18 191 lb 9.3 oz (86.9 kg)  07/20/18 190 lb (86.2 kg)     GEN:  Well nourished, well developed in no acute distress HEENT: Normal NECK: No JVD. LYMPHATICS: No lymphadenopathy CARDIAC: RRR, no murmur, no gallop, no edema. VASCULAR: 2+ bilateral radial and carotid pulses.  No bruits. RESPIRATORY:  Clear to auscultation without rales, wheezing  or rhonchi  ABDOMEN: Soft, non-tender, non-distended, No pulsatile mass, MUSCULOSKELETAL: No deformity  SKIN: Warm and dry NEUROLOGIC:  Alert and oriented x 3 PSYCHIATRIC:  Normal affect   ASSESSMENT:    1. Angina pectoris ([place])   2. Preoperative cardiovascular examination   3. Coronary artery disease involving native coronary artery of native heart with angina pectoris ([place])   4. Mixed hyperlipidemia  5. Essential hypertension   6. Type 2 diabetes mellitus with hyperosmolarity without coma, without long-term current use of insulin ([facility])    PLAN:    In order of problems listed above:  1. The coronary disease is stable.  He has not had recurrent angina.  He had single stent treatment of LAD bifurcation disease with single stent across a relatively large diagonal.  He is now 4 months post without difficulty.  Plan to switch Brilinta to Plavix starting in January.  He will be on 75 mg/day.  He has upcoming cervical disc surgery.  He will be able to pause dual antiplatelet therapy starting after December 28, 2018.  He can pause the medication up to 5 to 7 days before surgery and then should resume therapy upon direction of Dr. Ellene Rollins as soon as possible after. 2. After January 30 he will be eligible to pause dual antiplatelet therapy to allow cervical disc surgery.  He is cleared for that surgery after that point assuming no intercurrent development of chest pain or cardiac symptomatology.  Aspirin and Plavix will need to be resumed when safe after the surgery and he will complete a year of therapy.  3. As noted above 4. LDL target less than 70.  Most recent LDL was 50 in September 2019. 5. Blood pressure target 130/80 mmHg. 6. Hemoglobin A1c less than 7.  Complicated clinical situation with the patient actually doing quite well at this time.  We discussed aggressive secondary risk factor modification which he is currently doing.  We will give 6 months of uninterrupted dual antiplatelet  therapy before pausing therapy to proceed with cervical disc surgery which will manage the problem that is causing great difficulty with quality of life, sleep, and work.  This was an extended office visit with greater than 50% of the time spent in counseling and coordination of care.  We will copy this note to Dr. Kristeen Miss.  Medication Adjustments/Labs and Tests Ordered: Current medicines are reviewed at length with the patient today.  Concerns regarding medicines are outlined above.  No orders of the defined types were placed in this encounter.  No orders of the defined types were placed in this encounter.   Patient Instructions  Medication Instructions:  1) In mid to late January, discontinue Brilinta and start Plavix 75mg  once daily  If you need a refill on your cardiac medications before your next appointment, please call your pharmacy.   Lab work: None If you have labs (blood work) drawn today and your tests are completely normal, you will receive your results only by: Marland Kitchen MyChart Message (if you have MyChart) OR . A paper copy in the mail If you have any lab test that is abnormal or we need to change your treatment, we will call you to review the results.  Testing/Procedures: None  Follow-Up: At [facility], you and your health needs are our priority.  As part of our continuing mission to provide you with exceptional heart care, we have created designated Provider Care Teams.  These Care Teams include your primary Cardiologist (physician) and Advanced Practice Providers (APPs -  Physician Assistants and Nurse Practitioners) who all work together to provide you with the care you need, when you need it. You will need a follow up appointment in 6-8 months.  Please call our office 2 months in advance to schedule this appointment.  You may see Brian Rouge, MD or one of the following Advanced Practice Providers on your designated Care Team:  Truitt Merle, NP Cecilie Kicks,  NP . Kathyrn Drown, NP  Any Other Special Instructions Will Be Listed Below (If Applicable).       Signed, Sinclair Grooms, MD  09/14/2018 10:51 AM    [place]

## 2018-09-14 ENCOUNTER — Ambulatory Visit: Payer: Commercial Managed Care - PPO | Admitting: Interventional Cardiology

## 2018-09-14 ENCOUNTER — Encounter: Payer: Self-pay | Admitting: Interventional Cardiology

## 2018-09-14 VITALS — BP 118/80 | HR 77 | Ht 65.0 in | Wt 192.0 lb

## 2018-09-14 DIAGNOSIS — E1100 Type 2 diabetes mellitus with hyperosmolarity without nonketotic hyperglycemic-hyperosmolar coma (NKHHC): Secondary | ICD-10-CM

## 2018-09-14 DIAGNOSIS — E782 Mixed hyperlipidemia: Secondary | ICD-10-CM

## 2018-09-14 DIAGNOSIS — I209 Angina pectoris, unspecified: Secondary | ICD-10-CM

## 2018-09-14 DIAGNOSIS — I10 Essential (primary) hypertension: Secondary | ICD-10-CM

## 2018-09-14 DIAGNOSIS — Z01810 Encounter for preprocedural cardiovascular examination: Secondary | ICD-10-CM

## 2018-09-14 DIAGNOSIS — I25119 Atherosclerotic heart disease of native coronary artery with unspecified angina pectoris: Secondary | ICD-10-CM

## 2018-09-14 NOTE — Patient Instructions (Signed)
Medication Instructions:  1) In mid to late January, discontinue Brilinta and start Plavix 75mg  once daily  If you need a refill on your cardiac medications before your next appointment, please call your pharmacy.   Lab work: None If you have labs (blood work) drawn today and your tests are completely normal, you will receive your results only by: Marland Kitchen MyChart Message (if you have MyChart) OR . A paper copy in the mail If you have any lab test that is abnormal or we need to change your treatment, we will call you to review the results.  Testing/Procedures: None  Follow-Up: At [facility], you and your health needs are our priority.  As part of our continuing mission to provide you with exceptional heart care, we have created designated Provider Care Teams.  These Care Teams include your primary Cardiologist (physician) and Advanced Practice Providers (APPs -  Physician Assistants and Nurse Practitioners) who all work together to provide you with the care you need, when you need it. You will need a follow up appointment in 6-8 months.  Please call our office 2 months in advance to schedule this appointment.  You may see Jenkins Rouge, MD or one of the following Advanced Practice Providers on your designated Care Team:   Truitt Merle, NP Cecilie Kicks, NP . Kathyrn Drown, NP  Any Other Special Instructions Will Be Listed Below (If Applicable).

## 2018-09-18 ENCOUNTER — Ambulatory Visit (HOSPITAL_COMMUNITY): Payer: Commercial Managed Care - PPO

## 2018-09-20 ENCOUNTER — Ambulatory Visit (HOSPITAL_COMMUNITY): Payer: Commercial Managed Care - PPO

## 2018-09-21 DIAGNOSIS — E119 Type 2 diabetes mellitus without complications: Secondary | ICD-10-CM | POA: Diagnosis not present

## 2018-09-21 DIAGNOSIS — H2513 Age-related nuclear cataract, bilateral: Secondary | ICD-10-CM | POA: Diagnosis not present

## 2018-09-21 DIAGNOSIS — H52203 Unspecified astigmatism, bilateral: Secondary | ICD-10-CM | POA: Diagnosis not present

## 2018-09-22 ENCOUNTER — Ambulatory Visit (HOSPITAL_COMMUNITY): Payer: Commercial Managed Care - PPO

## 2018-09-22 ENCOUNTER — Telehealth (HOSPITAL_COMMUNITY): Payer: Self-pay | Admitting: *Deleted

## 2018-09-22 NOTE — Telephone Encounter (Signed)
Pt scheduled to start CR on 10/21 but pt has not attended.  Pt called to inquire about absences and investigate if pt is interested in participating.  VM left on cell phone for call back.

## 2018-09-25 ENCOUNTER — Ambulatory Visit (HOSPITAL_COMMUNITY): Payer: Commercial Managed Care - PPO

## 2018-09-27 ENCOUNTER — Ambulatory Visit (HOSPITAL_COMMUNITY): Payer: Commercial Managed Care - PPO

## 2018-09-28 NOTE — Telephone Encounter (Signed)
Pt called to inquire if interested in participating in CR.  Pt continues to be unable to contact. Pt had mentioned during CR orientation about his work schedule not being flexible.  Pt discharged from the CR program due to absences. Pt was to start CR on 09/18/18.

## 2018-09-29 ENCOUNTER — Ambulatory Visit (HOSPITAL_COMMUNITY): Payer: Commercial Managed Care - PPO

## 2018-10-02 ENCOUNTER — Ambulatory Visit (HOSPITAL_COMMUNITY): Payer: Commercial Managed Care - PPO

## 2018-10-04 ENCOUNTER — Ambulatory Visit (HOSPITAL_COMMUNITY): Payer: Commercial Managed Care - PPO

## 2018-10-06 ENCOUNTER — Ambulatory Visit (HOSPITAL_COMMUNITY): Payer: Commercial Managed Care - PPO

## 2018-10-09 ENCOUNTER — Ambulatory Visit (HOSPITAL_COMMUNITY): Payer: Commercial Managed Care - PPO

## 2018-10-11 ENCOUNTER — Ambulatory Visit (HOSPITAL_COMMUNITY): Payer: Commercial Managed Care - PPO

## 2018-10-13 ENCOUNTER — Ambulatory Visit (HOSPITAL_COMMUNITY): Payer: Commercial Managed Care - PPO

## 2018-10-16 ENCOUNTER — Ambulatory Visit (HOSPITAL_COMMUNITY): Payer: Commercial Managed Care - PPO

## 2018-10-18 ENCOUNTER — Ambulatory Visit (HOSPITAL_COMMUNITY): Payer: Commercial Managed Care - PPO

## 2018-10-20 ENCOUNTER — Ambulatory Visit (HOSPITAL_COMMUNITY): Payer: Commercial Managed Care - PPO

## 2018-10-23 ENCOUNTER — Ambulatory Visit (HOSPITAL_COMMUNITY): Payer: Commercial Managed Care - PPO

## 2018-10-25 ENCOUNTER — Ambulatory Visit (HOSPITAL_COMMUNITY): Payer: Commercial Managed Care - PPO

## 2018-10-27 ENCOUNTER — Ambulatory Visit (HOSPITAL_COMMUNITY): Payer: Commercial Managed Care - PPO

## 2018-10-30 ENCOUNTER — Ambulatory Visit (HOSPITAL_COMMUNITY): Payer: Commercial Managed Care - PPO

## 2018-11-01 ENCOUNTER — Ambulatory Visit (HOSPITAL_COMMUNITY): Payer: Commercial Managed Care - PPO

## 2018-11-03 ENCOUNTER — Ambulatory Visit (HOSPITAL_COMMUNITY): Payer: Commercial Managed Care - PPO

## 2018-11-06 ENCOUNTER — Ambulatory Visit (HOSPITAL_COMMUNITY): Payer: Commercial Managed Care - PPO

## 2018-11-08 ENCOUNTER — Ambulatory Visit (HOSPITAL_COMMUNITY): Payer: Commercial Managed Care - PPO

## 2018-11-10 ENCOUNTER — Ambulatory Visit (HOSPITAL_COMMUNITY): Payer: Commercial Managed Care - PPO

## 2018-11-13 ENCOUNTER — Ambulatory Visit (HOSPITAL_COMMUNITY): Payer: Commercial Managed Care - PPO

## 2018-11-15 ENCOUNTER — Ambulatory Visit (HOSPITAL_COMMUNITY): Payer: Commercial Managed Care - PPO

## 2018-11-17 ENCOUNTER — Ambulatory Visit (HOSPITAL_COMMUNITY): Payer: Commercial Managed Care - PPO

## 2018-11-20 ENCOUNTER — Ambulatory Visit (HOSPITAL_COMMUNITY): Payer: Commercial Managed Care - PPO

## 2018-11-24 ENCOUNTER — Ambulatory Visit (HOSPITAL_COMMUNITY): Payer: Commercial Managed Care - PPO

## 2018-11-27 ENCOUNTER — Ambulatory Visit (HOSPITAL_COMMUNITY): Payer: Commercial Managed Care - PPO

## 2018-11-28 ENCOUNTER — Telehealth: Payer: Self-pay | Admitting: *Deleted

## 2018-11-28 NOTE — Telephone Encounter (Signed)
   [facility] Pre-operative Risk Assessment    Request for surgical clearance:  1. What type of surgery is being performed? C4-5 ARTIFICIAL DISC REPLACEMENT   2. When is this surgery scheduled? TBD    3. What type of clearance is required (medical clearance vs. Pharmacy clearance to hold med vs. Both)? BOTH   4. Are there any medications that need to be held prior to surgery and how long?BRILINTA, ASA   5. Practice name and name of physician performing surgery? [facility]; DR. Ellene Route   6. What is your office phone number [phone]    7.   What is your office fax number [phone]  8.   Anesthesia type (None, local, MAC, general) ? LEFT MESSAGE FOR SURGEONS OFFICE TO CALL BACK WITH TYPE ANESTHESIA   Julaine Hua 11/28/2018, 11:09 AM  _________________________________________________________________   (provider comments below)

## 2018-11-28 NOTE — Telephone Encounter (Signed)
See Dr Thompson Caul complete office note 09/14/18 regarding pre op clearance and stopping Plavix and ASA for C-spine surgery. The following is copied from Dr Thompson Caul note-  1. Plan to switch Brilinta to Plavix starting in January.  He will be on 75 mg/day.  He has upcoming cervical disc surgery.  He will be able to pause dual antiplatelet therapy starting after December 28, 2018.  He can pause the medication up to 5 to 7 days before surgery and then should resume therapy upon direction of Dr. Ellene Route as soon as possible after. 2.  He is cleared for that surgery assuming no intercurrent development of chest pain or cardiac symptomatology.  Aspirin and Plavix will need to be resumed when safe after the surgery and he will complete a year of therapy.     I called pt today to check on his symptoms and confirm he is set to switch to Plavix. - cell phone number is no good.  I left a message on his home phone.  Kerin Ransom PA-C 11/28/2018 3:32 PM

## 2018-11-28 NOTE — Telephone Encounter (Signed)
Brian Rollins called back from Surgeon's office and confirmed anesthesia type is General.

## 2018-11-30 ENCOUNTER — Telehealth: Payer: Self-pay | Admitting: Interventional Cardiology

## 2018-11-30 ENCOUNTER — Other Ambulatory Visit: Payer: Self-pay | Admitting: Cardiology

## 2018-11-30 MED ORDER — CLOPIDOGREL BISULFATE 75 MG PO TABS: 75.0000 mg | ORAL_TABLET | Freq: Every day | ORAL | 3 refills | Status: DC

## 2018-11-30 NOTE — Telephone Encounter (Signed)
Follow up:   Patient returning call concerning  Medical clearance. Please call Patient.

## 2018-11-30 NOTE — Telephone Encounter (Signed)
New Message   Patient would like you to call him back on new number provided.

## 2018-11-30 NOTE — Telephone Encounter (Signed)
   Primary Cardiologist: Jenkins Rouge, MD/ Dr Tamala Julian  Chart reviewed and patient interviewed over the phone today as part of pre-operative protocol coverage. Given past medical history and time since last visit, based on ACC/AHA guidelines, Brian Rollins would be at acceptable risk for the planned procedure without further cardiovascular testing.   I spoke with the patient and explained Dr Jarrett Ables recommendations.  I will call in Plavix 75 mg and he can stop Brilinta.  After Jan 30th he can have his c-spine surgery scheduled.  OK to hold Plavix 5-7 days pre op if needed.   I will route this recommendation to the requesting party via Epic fax function and remove from pre-op pool.  Please call with questions.  Kerin Ransom, PA-C 11/30/2018, 4:02 PM

## 2018-12-01 ENCOUNTER — Ambulatory Visit (HOSPITAL_COMMUNITY): Payer: Commercial Managed Care - PPO

## 2018-12-01 ENCOUNTER — Other Ambulatory Visit: Payer: Self-pay | Admitting: Neurological Surgery

## 2018-12-01 NOTE — Telephone Encounter (Signed)
Dr Thompson Caul complete office note 09/14/18 : After January 30 he will be eligible to pause dual antiplatelet therapy to allow cervical disc surgery.  He is cleared for that surgery after that point assuming no intercurrent development of chest pain or cardiac symptomatology.  Aspirin and Plavix will need to be resumed when safe after the surgery and he will complete a year of therapy.

## 2018-12-04 ENCOUNTER — Ambulatory Visit (HOSPITAL_COMMUNITY): Payer: Commercial Managed Care - PPO

## 2018-12-06 ENCOUNTER — Ambulatory Visit (HOSPITAL_COMMUNITY): Payer: Commercial Managed Care - PPO

## 2018-12-08 ENCOUNTER — Ambulatory Visit (HOSPITAL_COMMUNITY): Payer: Commercial Managed Care - PPO

## 2018-12-11 ENCOUNTER — Ambulatory Visit (HOSPITAL_COMMUNITY): Payer: Commercial Managed Care - PPO

## 2018-12-13 ENCOUNTER — Ambulatory Visit (HOSPITAL_COMMUNITY): Payer: Commercial Managed Care - PPO

## 2018-12-15 ENCOUNTER — Ambulatory Visit (HOSPITAL_COMMUNITY): Payer: Commercial Managed Care - PPO

## 2018-12-18 ENCOUNTER — Ambulatory Visit (HOSPITAL_COMMUNITY): Payer: Commercial Managed Care - PPO

## 2018-12-20 ENCOUNTER — Ambulatory Visit (HOSPITAL_COMMUNITY): Payer: Commercial Managed Care - PPO

## 2018-12-22 NOTE — Pre-Procedure Instructions (Signed)
Brian Rollins  12/22/2018      [facility] [address] [place] [address] Phone: [phone] Fax: [phone]    Your procedure is scheduled on January 01, 2019.  Report to [facility] Admitting at 530 AM.  Call this number if you have problems the morning of surgery:  [phone]   Remember:  Do not eat or drink after midnight.    Take these medicines the morning of surgery with A SIP OF WATER  Metoprolol Succinate (Toprol XL)  Tasulosin (flomax) Pantoprazole (protonix)  Follow your surgeon's instructions on when to hold/resume aspirin and Plavix.  If no instructions were given call the office to determine how they would like to you take aspirin and Plavix  7 days prior to surgery STOP taking any Aleve, Naproxen, Ibuprofen, Motrin, Advil, Goody's, BC's, all herbal medications, fish oil, and all vitamins  WHAT DO I DO ABOUT MY DIABETES MEDICATION?   Marland Kitchen Do not take oral diabetes medicines (pills) the day prior to surgery or the morning of surgery-Sitagliptin-metformin (Janumet XR) and dapagliflozin propanediol (farxiga).   Reviewed and Endorsed by [facility] Patient Education Committee, August 2015   How to Manage Your Diabetes Before and After Surgery  Why is it important to control my blood sugar before and after surgery? . Improving blood sugar levels before and after surgery helps healing and can limit problems. . A way of improving blood sugar control is eating a healthy diet by: o  Eating less sugar and carbohydrates o  Increasing activity/exercise o  Talking with your doctor about reaching your blood sugar goals . High blood sugars (greater than 180 mg/dL) can raise your risk of infections and slow your recovery, so you will need to focus on controlling your diabetes during the weeks before surgery. . Make sure that the doctor who takes care of your diabetes knows about  your planned surgery including the date and location.  How do I manage my blood sugar before surgery? . Check your blood sugar at least 4 times a day, starting 2 days before surgery, to make sure that the level is not too high or low. o Check your blood sugar the morning of your surgery when you wake up and every 2 hours until you get to the Short Stay unit. . If your blood sugar is less than 70 mg/dL, you will need to treat for low blood sugar: o Do not take insulin. o Treat a low blood sugar (less than 70 mg/dL) with  cup of clear juice (cranberry or apple), 4 glucose tablets, OR glucose gel. Recheck blood sugar in 15 minutes after treatment (to make sure it is greater than 70 mg/dL). If your blood sugar is not greater than 70 mg/dL on recheck, call [phone] o  for further instructions. . Report your blood sugar to the short stay nurse when you get to Short Stay.  . If you are admitted to the hospital after surgery: o Your blood sugar will be checked by the staff and you will probably be given insulin after surgery (instead of oral diabetes medicines) to make sure you have good blood sugar levels. o The goal for blood sugar control after surgery is 80-180 mg/dL.   [facility]- Preparing For Surgery  Before surgery, you can play an important role. Because skin is not sterile, your skin needs to be as free of germs as possible. You can reduce the  number of germs on your skin by washing with CHG (chlorahexidine gluconate) Soap before surgery.  CHG is an antiseptic cleaner which kills germs and bonds with the skin to continue killing germs even after washing.    Oral Hygiene is also important to reduce your risk of infection.  Remember - BRUSH YOUR TEETH THE MORNING OF SURGERY WITH YOUR REGULAR TOOTHPASTE  Please do not use if you have an allergy to CHG or antibacterial soaps. If your skin becomes reddened/irritated stop using the CHG.  Do not shave (including legs and underarms) for at  least 48 hours prior to first CHG shower. It is OK to shave your face.  Please follow these instructions carefully.   1. Shower the NIGHT BEFORE SURGERY and the MORNING OF SURGERY with CHG.   2. If you chose to wash your hair, wash your hair first as usual with your normal shampoo.  3. After you shampoo, rinse your hair and body thoroughly to remove the shampoo.  4. Use CHG as you would any other liquid soap. You can apply CHG directly to the skin and wash gently with a scrungie or a clean washcloth.   5. Apply the CHG Soap to your body ONLY FROM THE NECK DOWN.  Do not use on open wounds or open sores. Avoid contact with your eyes, ears, mouth and genitals (private parts). Wash Face and genitals (private parts)  with your normal soap.  6. Wash thoroughly, paying special attention to the area where your surgery will be performed.  7. Thoroughly rinse your body with warm water from the neck down.  8. DO NOT shower/wash with your normal soap after using and rinsing off the CHG Soap.  9. Pat yourself dry with a CLEAN TOWEL.  10. Wear CLEAN PAJAMAS to bed the night before surgery, wear comfortable clothes the morning of surgery  11. Place CLEAN SHEETS on your bed the night of your first shower and DO NOT SLEEP WITH PETS.  Day of Surgery:  Do not apply any deodorants/lotions.  Please wear clean clothes to the hospital/surgery center.   Remember to brush your teeth WITH YOUR REGULAR TOOTHPASTE.   Do not wear jewelry  Do not wear lotions, powders, or colognes, or deodorant.  Men may shave face and neck.  Do not bring valuables to the hospital.  [facility] is not responsible for any belongings or valuables.  Contacts, dentures or bridgework may not be worn into surgery.  Leave your suitcase in the car.  After surgery it may be brought to your room.  For patients admitted to the hospital, discharge time will be determined by your treatment team.  Patients discharged the day of  surgery will not be allowed to drive home.   Please read over the following fact sheets that you were given.

## 2018-12-25 ENCOUNTER — Encounter (HOSPITAL_COMMUNITY)
Admission: RE | Admit: 2018-12-25 | Discharge: 2018-12-25 | Disposition: A | Discharge status: Home / Self Care | Payer: Commercial Managed Care - PPO | Source: Ambulatory Visit | Attending: Neurological Surgery | Admitting: Neurological Surgery

## 2018-12-25 ENCOUNTER — Encounter (HOSPITAL_COMMUNITY): Payer: Self-pay

## 2018-12-25 ENCOUNTER — Other Ambulatory Visit: Payer: Self-pay

## 2018-12-25 DIAGNOSIS — Z01812 Encounter for preprocedural laboratory examination: Secondary | ICD-10-CM | POA: Insufficient documentation

## 2018-12-25 LAB — BASIC METABOLIC PANEL
Anion gap: 11 (ref 5–15)
BUN: 16 mg/dL (ref 8–23)
CALCIUM: 9.4 mg/dL (ref 8.9–10.3)
CO2: 22 mmol/L (ref 22–32)
CREATININE: 0.95 mg/dL (ref 0.61–1.24)
Chloride: 105 mmol/L (ref 98–111)
GFR calc Af Amer: >60 mL/min (ref >60)
GLUCOSE: 175 mg/dL — AB (ref 70–99)
Potassium: 4 mmol/L (ref 3.5–5.1)
SODIUM: 138 mmol/L (ref 135–145)

## 2018-12-25 LAB — CBC
HCT: 43.6 % (ref 39.0–52.0)
Hemoglobin: 14.2 g/dL (ref 13.0–17.0)
MCH: 29.2 pg (ref 26.0–34.0)
MCHC: 32.6 g/dL (ref 30.0–36.0)
MCV: 89.7 fL (ref 80.0–100.0)
Platelets: 197 10*3/uL (ref 150–400)
RBC: 4.86 MIL/uL (ref 4.22–5.81)
RDW: 12.9 % (ref 11.5–15.5)
WBC: 5.6 10*3/uL (ref 4.0–10.5)
nRBC: 0 % (ref 0.0–0.2)

## 2018-12-25 LAB — SURGICAL PCR SCREEN
MRSA, PCR: NEGATIVE
STAPHYLOCOCCUS AUREUS: POSITIVE — AB

## 2018-12-25 LAB — TYPE AND SCREEN
ABO/RH(D): A POS
Antibody Screen: NEGATIVE

## 2018-12-25 LAB — HEMOGLOBIN A1C
Hgb A1c MFr Bld: 8.9 % — ABNORMAL HIGH (ref 4.8–5.6)
Mean Plasma Glucose: 208.73 mg/dL

## 2018-12-25 LAB — ABO/RH: ABO/RH(D): A POS

## 2018-12-25 LAB — GLUCOSE, CAPILLARY: GLUCOSE-CAPILLARY: 141 mg/dL — AB (ref 70–99)

## 2018-12-25 NOTE — Progress Notes (Addendum)
PCP - Lawerance Cruel, MD Cardiologist - Jenkins Rouge, MD  Chest x-ray - 06/30/18 in EPIC EKG -  07/20/18 in EPIC  Stress Test -  04/2015 in EPIC ECHO - 04/2015 In EPIC  Cardiac Cath - 05/2018 in EPIC  Sleep Study -  Yes-negative CPAP - n/a  Fasting Blood Sugar - 150 Checks Blood Sugar 2 times a day  Blood Thinner Instructions: Plavix-hold 12/27/2018 Aspirin Instructions: Aspirin 81 mg: hold 12/27/2018  Anesthesia review: Yes, heart history, A1C 8.9  Patient denies shortness of breath, fever, cough and chest pain at PAT appointment  Patient verbalized understanding of instructions that were given to them at the PAT appointment. Patient was also instructed that they will need to review over the PAT instructions again at home before surgery.

## 2018-12-25 NOTE — Progress Notes (Signed)
I called a prescription for Mupirocin ointment to [facility], [address] , [place], [place].

## 2018-12-26 NOTE — Progress Notes (Addendum)
Anesthesia Chart Review:  Case:  [id number] Date/Time:  01/01/19 0715   Procedure:  Cervical 4-5 Artificial disc replacement (N/A ) - Cervical 4-5 Artificial disc replacement   Anesthesia type:  General   Pre-op diagnosis:  Cervical spondylosis with radiculopathy   Location:  [facility] OR ROOM 21 / [place] [facility]   Surgeon:  Kristeen Miss, MD      DISCUSSION: 63 yo male former smoker. Pertinent hx includes OSA not on CPAP, GERD, DMII, HTN, CAD s/p DES to mid LAD 06/27/2018.  Pt follows with Dr. Daneen Schick for CAD s/p DES to mid LAD 06/27/2018. Cleared for surgery per OV note 09/14/2018: "The coronary disease is stable.  He has not had recurrent angina.  He had single stent treatment of LAD bifurcation disease with single stent across a relatively large diagonal.  He is now 4 months post without difficulty.  Plan to switch Brilinta to Plavix starting in January.  He will be on 75 mg/day.  He has upcoming cervical disc surgery.  He will be able to pause dual antiplatelet therapy starting after December 28, 2018.  He can pause the medication up to 5 to 7 days before surgery and then should resume therapy upon direction of Dr. Ellene Route as soon as possible after."  Anticipate he can proceed as planned barring acute status change.  VS: BP 140/78   Pulse 77   Temp 36.5 C   Resp 20   Ht 5\' 5"  (1.651 m)   Wt 85.1 kg   SpO2 99%   BMI 31.22 kg/m   PROVIDERS: Lawerance Cruel, MD is PCP  Daneen Schick, MD is Cardiologist  LABS: Labs reviewed: Acceptable for surgery. Elevated A1c, Dr. Ellene Route notified. Per Janett Billow at Dr. Clarice Pole office, pt's PCP was contacted and felt it was okay to proceed with surgery. (all labs ordered are listed, but only abnormal results are displayed)  Labs Reviewed  SURGICAL PCR SCREEN - Abnormal; Notable for the following components:      Result Value   Staphylococcus aureus POSITIVE (*)    All other components within normal limits  GLUCOSE, CAPILLARY - Abnormal; Notable for the  following components:   Glucose-Capillary 141 (*)    All other components within normal limits  BASIC METABOLIC PANEL - Abnormal; Notable for the following components:   Glucose, Bld 175 (*)    All other components within normal limits  HEMOGLOBIN A1C - Abnormal; Notable for the following components:   Hgb A1c MFr Bld 8.9 (*)    All other components within normal limits  CBC  TYPE AND SCREEN  ABO/RH     IMAGES: CHEST - 2 VIEW 06/30/2018  COMPARISON:  None.  FINDINGS: Lung volumes and mediastinal contours are normal. A coronary artery stent is evident on the lateral view. Visualized tracheal air column is within normal limits. Both lungs are clear. No pneumothorax or pleural effusion. No acute osseous abnormality identified. Negative visible bowel gas pattern.  IMPRESSION: No cardiopulmonary abnormality.  EKG: 07/20/2018:  NSR. Rate 74. RBBB. T wave abnormality, consider lateral ischemia.   CV: Cath 06/27/2018:  A stent was successfully placed.    Single-vessel disease with complex [place] 1, 1, 1 mid LAD bifurcation 99% stenosis with the large second diagonal 50%.  TIMI grade II flow was noted in the LAD.  The LAD is otherwise normal.  Normal left main  Normal circumflex coronary artery, which is dominant.  Nondominant right coronary.  Successful complex bifurcation PCI reducing 99% LAD stenosis to  less than 10% using a 3.5 x 20 mm Onyx postdilated to 4.0 mm at high pressure improving TIMI grade II flow to TIMI grade III.  Post stent deployment the diagonal contains 95% ostial stenosis and was reduced to 70% using kissing balloon technique.  Diagonal flow remained TIMI grade III.  Recommendations:   Aspirin and Brilinta x6 months.  Thereafter would recommend aspirin and clopidogrel for an additional 6 months before dropping clopidogrel.  Aggressive risk factor modification  Past Medical History:  Diagnosis Date  . Chest discomfort   . Chest pain on exertion    . GERD (gastroesophageal reflux disease)   . High cholesterol   . Hydrocele, left   . Hypertension   . Keratoconus   . OSA (obstructive sleep apnea)    "mild; don't wear mask" (06/27/2018)  . Pneumonia 1980s X 1  . Rapid heart beat   . Type II diabetes mellitus ([place])     Past Surgical History:  Procedure Laterality Date  . CORONARY ANGIOPLASTY WITH STENT PLACEMENT  06/27/2018  . CORONARY STENT INTERVENTION N/A 06/27/2018   Procedure: CORONARY STENT INTERVENTION;  Surgeon: Belva Crome, MD;  Location: [place] [facility];  Service: Cardiovascular;  Laterality: N/A;  . HYDROCELE EXCISION  12/03/2011   Procedure: HYDROCELECTOMY ADULT;  Surgeon: Claybon Jabs, MD;  Location: [facility];  Service: Urology;  Laterality: Left;  . KNEE ARTHROSCOPY Bilateral 2000s X 2   "removed scar tissue on the right; repaired torn meniscus"  . KNEE ARTHROSCOPY W/ ACL RECONSTRUCTION Right 1990s  . RIGHT/LEFT HEART CATH AND CORONARY ANGIOGRAPHY N/A 06/27/2018   Procedure: RIGHT/LEFT HEART CATH AND CORONARY ANGIOGRAPHY;  Surgeon: Belva Crome, MD;  Location: [place] [facility];  Service: Cardiovascular;  Laterality: N/A;    MEDICATIONS: . aspirin EC 81 MG tablet  . benazepril (LOTENSIN) 20 MG tablet  . clopidogrel (PLAVIX) 75 MG tablet  . dapagliflozin propanediol (FARXIGA) 10 MG TABS tablet  . gabapentin (NEURONTIN) 300 MG capsule  . metoprolol succinate (TOPROL-XL) 100 MG 24 hr tablet  . nitroGLYCERIN (NITROSTAT) 0.4 MG SL tablet  . pantoprazole (PROTONIX) 40 MG tablet  . simvastatin (ZOCOR) 40 MG tablet  . SitaGLIPtin-MetFORMIN HCl (JANUMET XR) 50-1000 MG TB24  . tamsulosin (FLOMAX) 0.4 MG CAPS capsule   No current facility-administered medications for this encounter.     Wynonia Musty [facility] Short Stay Center/Anesthesiology Phone [phone] 12/26/2018 12:37 PM

## 2018-12-26 NOTE — Anesthesia Preprocedure Evaluation (Addendum)
Anesthesia Evaluation  Patient identified by MRN, date of birth, ID band Patient awake    Reviewed: Allergy & Precautions, H&P , NPO status , Patient's Chart, lab work & pertinent test results, reviewed documented beta blocker date and time   Airway Mallampati: III  TM Distance: >3 FB Neck ROM: Full    Dental no notable dental hx. (+) Teeth Intact, Dental Advisory Given   Pulmonary neg pulmonary ROS, former smoker,    Pulmonary exam normal breath sounds clear to auscultation       Cardiovascular Exercise Tolerance: Good hypertension, Pt. on medications and Pt. on home beta blockers + CAD and + Cardiac Stents   Rhythm:Regular Rate:Normal     Neuro/Psych negative neurological ROS  negative psych ROS   GI/Hepatic Neg liver ROS, GERD  Medicated and Controlled,  Endo/Other  diabetes, Type 2, Oral Hypoglycemic Agents  Renal/GU negative Renal ROS  negative genitourinary   Musculoskeletal   Abdominal   Peds  Hematology negative hematology ROS (+)   Anesthesia Other Findings   Reproductive/Obstetrics negative OB ROS                           Anesthesia Physical Anesthesia Plan  ASA: III  Anesthesia Plan: General   Post-op Pain Management:    Induction: Intravenous  PONV Risk Score and Plan: 3 and Ondansetron, Dexamethasone and Midazolam  Airway Management Planned: Oral ETT  Additional Equipment:   Intra-op Plan:   Post-operative Plan: Extubation in OR  Informed Consent: I have reviewed the patients History and Physical, chart, labs and discussed the procedure including the risks, benefits and alternatives for the proposed anesthesia with the patient or authorized representative who has indicated his/her understanding and acceptance.     Dental advisory given  Plan Discussed with: CRNA  Anesthesia Plan Comments: (See PAT note by Karoline Caldwell, PA-C )       Anesthesia Quick  Evaluation

## 2019-01-01 ENCOUNTER — Encounter (HOSPITAL_COMMUNITY)
Admission: RE | Disposition: A | Discharge status: Home / Self Care | Payer: Self-pay | Source: Ambulatory Visit | Attending: Neurological Surgery

## 2019-01-01 ENCOUNTER — Observation Stay (HOSPITAL_COMMUNITY)
Admission: RE | Admit: 2019-01-01 | Discharge: 2019-01-01 | Disposition: A | Discharge status: Home / Self Care | Payer: Commercial Managed Care - PPO | Source: Ambulatory Visit | Attending: Neurological Surgery | Admitting: Neurological Surgery

## 2019-01-01 ENCOUNTER — Ambulatory Visit (HOSPITAL_COMMUNITY): Payer: Commercial Managed Care - PPO

## 2019-01-01 ENCOUNTER — Ambulatory Visit (HOSPITAL_COMMUNITY): Payer: Commercial Managed Care - PPO | Admitting: Physician Assistant

## 2019-01-01 ENCOUNTER — Ambulatory Visit (HOSPITAL_COMMUNITY): Payer: Commercial Managed Care - PPO | Admitting: Anesthesiology

## 2019-01-01 ENCOUNTER — Encounter (HOSPITAL_COMMUNITY): Payer: Self-pay | Admitting: Anesthesiology

## 2019-01-01 DIAGNOSIS — I10 Essential (primary) hypertension: Secondary | ICD-10-CM | POA: Diagnosis not present

## 2019-01-01 DIAGNOSIS — Z79899 Other long term (current) drug therapy: Secondary | ICD-10-CM | POA: Insufficient documentation

## 2019-01-01 DIAGNOSIS — M4802 Spinal stenosis, cervical region: Secondary | ICD-10-CM | POA: Diagnosis not present

## 2019-01-01 DIAGNOSIS — I2510 Atherosclerotic heart disease of native coronary artery without angina pectoris: Secondary | ICD-10-CM | POA: Diagnosis not present

## 2019-01-01 DIAGNOSIS — Z87891 Personal history of nicotine dependence: Secondary | ICD-10-CM | POA: Diagnosis not present

## 2019-01-01 DIAGNOSIS — M50121 Cervical disc disorder at C4-C5 level with radiculopathy: Secondary | ICD-10-CM | POA: Insufficient documentation

## 2019-01-01 DIAGNOSIS — M4722 Other spondylosis with radiculopathy, cervical region: Secondary | ICD-10-CM | POA: Diagnosis not present

## 2019-01-01 DIAGNOSIS — E119 Type 2 diabetes mellitus without complications: Secondary | ICD-10-CM | POA: Insufficient documentation

## 2019-01-01 DIAGNOSIS — Z7984 Long term (current) use of oral hypoglycemic drugs: Secondary | ICD-10-CM | POA: Diagnosis not present

## 2019-01-01 DIAGNOSIS — Z955 Presence of coronary angioplasty implant and graft: Secondary | ICD-10-CM | POA: Insufficient documentation

## 2019-01-01 DIAGNOSIS — K219 Gastro-esophageal reflux disease without esophagitis: Secondary | ICD-10-CM | POA: Diagnosis not present

## 2019-01-01 DIAGNOSIS — Z7982 Long term (current) use of aspirin: Secondary | ICD-10-CM | POA: Diagnosis not present

## 2019-01-01 DIAGNOSIS — Z419 Encounter for procedure for purposes other than remedying health state, unspecified: Secondary | ICD-10-CM

## 2019-01-01 DIAGNOSIS — Z01812 Encounter for preprocedural laboratory examination: Secondary | ICD-10-CM | POA: Diagnosis not present

## 2019-01-01 DIAGNOSIS — Z7902 Long term (current) use of antithrombotics/antiplatelets: Secondary | ICD-10-CM | POA: Diagnosis not present

## 2019-01-01 DIAGNOSIS — M5412 Radiculopathy, cervical region: Secondary | ICD-10-CM | POA: Diagnosis present

## 2019-01-01 DIAGNOSIS — Z981 Arthrodesis status: Secondary | ICD-10-CM | POA: Diagnosis not present

## 2019-01-01 HISTORY — PX: CERVICAL DISC ARTHROPLASTY: SHX587

## 2019-01-01 LAB — GLUCOSE, CAPILLARY
Glucose-Capillary: 295 mg/dL — ABNORMAL HIGH (ref 70–99)
Glucose-Capillary: 292 mg/dL — ABNORMAL HIGH (ref 70–99)

## 2019-01-01 SURGERY — CERVICAL ANTERIOR DISC ARTHROPLASTY
Anesthesia: General | Site: Spine Cervical

## 2019-01-01 MED ORDER — ACETAMINOPHEN 650 MG RE SUPP: 650.0000 mg | RECTAL | Status: DC | PRN

## 2019-01-01 MED ORDER — POLYETHYLENE GLYCOL 3350 17 G PO PACK: 17.0000 g | PACK | Freq: Every day | ORAL | Status: DC | PRN

## 2019-01-01 MED ORDER — TAMSULOSIN HCL 0.4 MG PO CAPS: 0.8000 mg | ORAL_CAPSULE | Freq: Every day | ORAL | Status: DC

## 2019-01-01 MED ORDER — ACETAMINOPHEN 500 MG PO TABS
1000.0000 mg | ORAL_TABLET | Freq: Once | ORAL | Status: AC
  Administered 2019-01-01: 1000 mg via ORAL
  Filled 2019-01-01: qty 2

## 2019-01-01 MED ORDER — DEXAMETHASONE SODIUM PHOSPHATE 10 MG/ML IJ SOLN
INTRAMUSCULAR | Status: DC | PRN
  Administered 2019-01-01: 10 mg via INTRAVENOUS

## 2019-01-01 MED ORDER — DIAZEPAM 5 MG PO TABS: 5.0000 mg | ORAL_TABLET | Freq: Four times a day (QID) | ORAL | 0 refills | Status: DC | PRN

## 2019-01-01 MED ORDER — BUPIVACAINE HCL (PF) 0.5 % IJ SOLN
INTRAMUSCULAR | Status: AC
  Filled 2019-01-01: qty 30

## 2019-01-01 MED ORDER — INSULIN ASPART 100 UNIT/ML ~~LOC~~ SOLN
4.0000 [IU] | Freq: Once | SUBCUTANEOUS | Status: AC
  Administered 2019-01-01: 4 [IU] via SUBCUTANEOUS

## 2019-01-01 MED ORDER — SODIUM CHLORIDE 0.9% FLUSH
3.0000 mL | Freq: Two times a day (BID) | INTRAVENOUS | Status: DC
  Administered 2019-01-01: 3 mL via INTRAVENOUS

## 2019-01-01 MED ORDER — METHOCARBAMOL 500 MG PO TABS
500.0000 mg | ORAL_TABLET | Freq: Four times a day (QID) | ORAL | Status: DC | PRN
  Administered 2019-01-01: 500 mg via ORAL
  Filled 2019-01-01: qty 1

## 2019-01-01 MED ORDER — LIDOCAINE-EPINEPHRINE 1 %-1:100000 IJ SOLN
INTRAMUSCULAR | Status: AC
  Filled 2019-01-01: qty 1

## 2019-01-01 MED ORDER — SODIUM CHLORIDE 0.9 % IV SOLN: 250.0000 mL | INTRAVENOUS | Status: DC

## 2019-01-01 MED ORDER — FENTANYL CITRATE (PF) 250 MCG/5ML IJ SOLN
INTRAMUSCULAR | Status: DC | PRN
  Administered 2019-01-01: 50 ug via INTRAVENOUS
  Administered 2019-01-01: 100 ug via INTRAVENOUS

## 2019-01-01 MED ORDER — MIDAZOLAM HCL 5 MG/5ML IJ SOLN
INTRAMUSCULAR | Status: DC | PRN
  Administered 2019-01-01: 2 mg via INTRAVENOUS

## 2019-01-01 MED ORDER — ONDANSETRON HCL 4 MG PO TABS: 4.0000 mg | ORAL_TABLET | Freq: Four times a day (QID) | ORAL | Status: DC | PRN

## 2019-01-01 MED ORDER — SENNA 8.6 MG PO TABS: 1.0000 | ORAL_TABLET | Freq: Two times a day (BID) | ORAL | Status: DC

## 2019-01-01 MED ORDER — ONDANSETRON HCL 4 MG/2ML IJ SOLN: 4.0000 mg | Freq: Four times a day (QID) | INTRAMUSCULAR | Status: DC | PRN

## 2019-01-01 MED ORDER — THROMBIN 5000 UNITS EX SOLR
CUTANEOUS | Status: AC
  Filled 2019-01-01: qty 5000

## 2019-01-01 MED ORDER — PROPOFOL 10 MG/ML IV BOLUS
INTRAVENOUS | Status: DC | PRN
  Administered 2019-01-01: 130 mg via INTRAVENOUS
  Administered 2019-01-01: 70 mg via INTRAVENOUS

## 2019-01-01 MED ORDER — GABAPENTIN 300 MG PO CAPS: 300.0000 mg | ORAL_CAPSULE | Freq: Every day | ORAL | Status: DC

## 2019-01-01 MED ORDER — LIDOCAINE 2% (20 MG/ML) 5 ML SYRINGE
INTRAMUSCULAR | Status: DC | PRN
  Administered 2019-01-01: 60 mg via INTRAVENOUS

## 2019-01-01 MED ORDER — OXYCODONE-ACETAMINOPHEN 5-325 MG PO TABS
1.0000 | ORAL_TABLET | ORAL | Status: DC | PRN
  Administered 2019-01-01: 1 via ORAL
  Filled 2019-01-01: qty 1

## 2019-01-01 MED ORDER — BUPIVACAINE HCL (PF) 0.5 % IJ SOLN
INTRAMUSCULAR | Status: DC | PRN
  Administered 2019-01-01: 5 mL

## 2019-01-01 MED ORDER — SODIUM CHLORIDE 0.9 % IV SOLN
INTRAVENOUS | Status: DC | PRN
  Administered 2019-01-01: 500 mL

## 2019-01-01 MED ORDER — PHENYLEPHRINE 40 MCG/ML (10ML) SYRINGE FOR IV PUSH (FOR BLOOD PRESSURE SUPPORT)
PREFILLED_SYRINGE | INTRAVENOUS | Status: AC
  Filled 2019-01-01: qty 10

## 2019-01-01 MED ORDER — ONDANSETRON HCL 4 MG/2ML IJ SOLN
INTRAMUSCULAR | Status: AC
  Filled 2019-01-01: qty 2

## 2019-01-01 MED ORDER — NITROGLYCERIN 0.4 MG SL SUBL: 0.4000 mg | SUBLINGUAL_TABLET | SUBLINGUAL | Status: DC | PRN

## 2019-01-01 MED ORDER — PHENOL 1.4 % MT LIQD: 1.0000 | OROMUCOSAL | Status: DC | PRN

## 2019-01-01 MED ORDER — OXYCODONE-ACETAMINOPHEN 5-325 MG PO TABS: 1.0000 | ORAL_TABLET | ORAL | 0 refills | Status: DC | PRN

## 2019-01-01 MED ORDER — 0.9 % SODIUM CHLORIDE (POUR BTL) OPTIME
TOPICAL | Status: DC | PRN
  Administered 2019-01-01: 1000 mL

## 2019-01-01 MED ORDER — LACTATED RINGERS IV SOLN
INTRAVENOUS | Status: DC | PRN
  Administered 2019-01-01 (×2): via INTRAVENOUS

## 2019-01-01 MED ORDER — DEXAMETHASONE SODIUM PHOSPHATE 10 MG/ML IJ SOLN
INTRAMUSCULAR | Status: AC
  Filled 2019-01-01: qty 1

## 2019-01-01 MED ORDER — SUGAMMADEX SODIUM 200 MG/2ML IV SOLN
INTRAVENOUS | Status: DC | PRN
  Administered 2019-01-01: 169.6 mg via INTRAVENOUS

## 2019-01-01 MED ORDER — ROCURONIUM BROMIDE 50 MG/5ML IV SOSY
PREFILLED_SYRINGE | INTRAVENOUS | Status: AC
  Filled 2019-01-01: qty 5

## 2019-01-01 MED ORDER — PROPOFOL 10 MG/ML IV BOLUS
INTRAVENOUS | Status: AC
  Filled 2019-01-01: qty 40

## 2019-01-01 MED ORDER — ROCURONIUM BROMIDE 50 MG/5ML IV SOSY
PREFILLED_SYRINGE | INTRAVENOUS | Status: DC | PRN
  Administered 2019-01-01: 10 mg via INTRAVENOUS
  Administered 2019-01-01: 50 mg via INTRAVENOUS
  Administered 2019-01-01: 20 mg via INTRAVENOUS

## 2019-01-01 MED ORDER — INSULIN ASPART 100 UNIT/ML ~~LOC~~ SOLN
SUBCUTANEOUS | Status: AC
  Filled 2019-01-01: qty 1

## 2019-01-01 MED ORDER — LINAGLIPTIN 5 MG PO TABS
5.0000 mg | ORAL_TABLET | Freq: Every day | ORAL | Status: DC
  Filled 2019-01-01: qty 1

## 2019-01-01 MED ORDER — SODIUM CHLORIDE 0.9 % IV SOLN
INTRAVENOUS | Status: DC | PRN
  Administered 2019-01-01: 25 ug/min via INTRAVENOUS

## 2019-01-01 MED ORDER — SUCCINYLCHOLINE CHLORIDE 200 MG/10ML IV SOSY
PREFILLED_SYRINGE | INTRAVENOUS | Status: AC
  Filled 2019-01-01: qty 10

## 2019-01-01 MED ORDER — MORPHINE SULFATE (PF) 2 MG/ML IV SOLN: 2.0000 mg | INTRAVENOUS | Status: DC | PRN

## 2019-01-01 MED ORDER — CEFAZOLIN SODIUM-DEXTROSE 2-4 GM/100ML-% IV SOLN
2.0000 g | INTRAVENOUS | Status: AC
  Administered 2019-01-01: 2 g via INTRAVENOUS
  Filled 2019-01-01: qty 100

## 2019-01-01 MED ORDER — SITAGLIP PHOS-METFORMIN HCL ER 50-1000 MG PO TB24: 2.0000 | ORAL_TABLET | Freq: Every day | ORAL | Status: DC

## 2019-01-01 MED ORDER — METHOCARBAMOL 1000 MG/10ML IJ SOLN
500.0000 mg | Freq: Four times a day (QID) | INTRAVENOUS | Status: DC | PRN
  Filled 2019-01-01: qty 5

## 2019-01-01 MED ORDER — SIMVASTATIN 20 MG PO TABS: 40.0000 mg | ORAL_TABLET | Freq: Every day | ORAL | Status: DC

## 2019-01-01 MED ORDER — MIDAZOLAM HCL 2 MG/2ML IJ SOLN
INTRAMUSCULAR | Status: AC
  Filled 2019-01-01: qty 2

## 2019-01-01 MED ORDER — FLEET ENEMA 7-19 GM/118ML RE ENEM: 1.0000 | ENEMA | Freq: Once | RECTAL | Status: DC | PRN

## 2019-01-01 MED ORDER — CHLORHEXIDINE GLUCONATE CLOTH 2 % EX PADS: 6.0000 | MEDICATED_PAD | Freq: Once | CUTANEOUS | Status: DC

## 2019-01-01 MED ORDER — BENAZEPRIL HCL 20 MG PO TABS
20.0000 mg | ORAL_TABLET | Freq: Every day | ORAL | Status: DC
  Filled 2019-01-01: qty 1

## 2019-01-01 MED ORDER — LIDOCAINE 2% (20 MG/ML) 5 ML SYRINGE
INTRAMUSCULAR | Status: AC
  Filled 2019-01-01: qty 5

## 2019-01-01 MED ORDER — MENTHOL 3 MG MT LOZG: 1.0000 | LOZENGE | OROMUCOSAL | Status: DC | PRN

## 2019-01-01 MED ORDER — METOPROLOL SUCCINATE ER 100 MG PO TB24: 100.0000 mg | ORAL_TABLET | Freq: Every day | ORAL | Status: DC

## 2019-01-01 MED ORDER — DOCUSATE SODIUM 100 MG PO CAPS: 100.0000 mg | ORAL_CAPSULE | Freq: Two times a day (BID) | ORAL | Status: DC

## 2019-01-01 MED ORDER — INSULIN ASPART 100 UNIT/ML ~~LOC~~ SOLN
0.0000 [IU] | Freq: Three times a day (TID) | SUBCUTANEOUS | Status: DC
  Administered 2019-01-01: 4 [IU] via SUBCUTANEOUS

## 2019-01-01 MED ORDER — FENTANYL CITRATE (PF) 250 MCG/5ML IJ SOLN
INTRAMUSCULAR | Status: AC
  Filled 2019-01-01: qty 5

## 2019-01-01 MED ORDER — PANTOPRAZOLE SODIUM 40 MG PO TBEC: 40.0000 mg | DELAYED_RELEASE_TABLET | Freq: Every day | ORAL | Status: DC

## 2019-01-01 MED ORDER — SODIUM CHLORIDE 0.9% FLUSH: 3.0000 mL | INTRAVENOUS | Status: DC | PRN

## 2019-01-01 MED ORDER — LIDOCAINE-EPINEPHRINE 1 %-1:100000 IJ SOLN
INTRAMUSCULAR | Status: DC | PRN
  Administered 2019-01-01: 5 mL

## 2019-01-01 MED ORDER — THROMBIN 5000 UNITS EX SOLR
OROMUCOSAL | Status: DC | PRN
  Administered 2019-01-01: 5 mL via TOPICAL

## 2019-01-01 MED ORDER — CANAGLIFLOZIN 100 MG PO TABS: 100.0000 mg | ORAL_TABLET | Freq: Every day | ORAL | Status: DC

## 2019-01-01 MED ORDER — ACETAMINOPHEN 325 MG PO TABS: 650.0000 mg | ORAL_TABLET | ORAL | Status: DC | PRN

## 2019-01-01 MED ORDER — ONDANSETRON HCL 4 MG/2ML IJ SOLN
INTRAMUSCULAR | Status: DC | PRN
  Administered 2019-01-01: 4 mg via INTRAVENOUS

## 2019-01-01 MED ORDER — METFORMIN HCL ER 500 MG PO TB24
2000.0000 mg | ORAL_TABLET | Freq: Every day | ORAL | Status: DC
  Filled 2019-01-01: qty 1

## 2019-01-01 MED ORDER — BISACODYL 10 MG RE SUPP: 10.0000 mg | Freq: Every day | RECTAL | Status: DC | PRN

## 2019-01-01 SURGICAL SUPPLY — 50 items
ALCOHOL ISOPROPYL (RUBBING) (MISCELLANEOUS) ×3 IMPLANT
BAG DECANTER FOR FLEXI CONT (MISCELLANEOUS) ×3 IMPLANT
BENZOIN TINCTURE PRP APPL 2/3 (GAUZE/BANDAGES/DRESSINGS) IMPLANT
BIT DRILL NEURO 2X3.1 SFT TUCH (MISCELLANEOUS) ×1 IMPLANT
CANISTER SUCT 3000ML PPV (MISCELLANEOUS) ×3 IMPLANT
COVER WAND RF STERILE (DRAPES) IMPLANT
DECANTER SPIKE VIAL GLASS SM (MISCELLANEOUS) ×6 IMPLANT
DERMABOND ADVANCED (GAUZE/BANDAGES/DRESSINGS) ×2
DERMABOND ADVANCED .7 DNX12 (GAUZE/BANDAGES/DRESSINGS) ×1 IMPLANT
DISC MOBI-C CERVICAL 13X17 H5 (Miscellaneous) ×3 IMPLANT
DRAPE C-ARM 42X72 X-RAY (DRAPES) ×3 IMPLANT
DRAPE C-ARMOR (DRAPES) ×3 IMPLANT
DRAPE LAPAROTOMY 100X72 PEDS (DRAPES) ×3 IMPLANT
DRAPE MICROSCOPE LEICA (MISCELLANEOUS) ×3 IMPLANT
DRAPE POUCH INSTRU U-SHP 10X18 (DRAPES) IMPLANT
DRILL NEURO 2X3.1 SOFT TOUCH (MISCELLANEOUS) ×3
DURAPREP 6ML APPLICATOR 50/CS (WOUND CARE) ×3 IMPLANT
ELECT REM PT RETURN 9FT ADLT (ELECTROSURGICAL) ×3
ELECTRODE REM PT RTRN 9FT ADLT (ELECTROSURGICAL) ×1 IMPLANT
GAUZE 4X4 16PLY RFD (DISPOSABLE) IMPLANT
GLOVE BIO SURGEON STRL SZ8 (GLOVE) ×3 IMPLANT
GLOVE BIO SURGEON STRL SZ8.5 (GLOVE) ×3 IMPLANT
GLOVE BIOGEL PI IND STRL 7.0 (GLOVE) ×3 IMPLANT
GLOVE BIOGEL PI IND STRL 7.5 (GLOVE) ×2 IMPLANT
GLOVE BIOGEL PI IND STRL 8.5 (GLOVE) ×1 IMPLANT
GLOVE BIOGEL PI INDICATOR 7.0 (GLOVE) ×6
GLOVE BIOGEL PI INDICATOR 7.5 (GLOVE) ×4
GLOVE BIOGEL PI INDICATOR 8.5 (GLOVE) ×2
GLOVE ECLIPSE 8.5 STRL (GLOVE) ×3 IMPLANT
GOWN STRL REUS W/ TWL LRG LVL3 (GOWN DISPOSABLE) IMPLANT
GOWN STRL REUS W/ TWL XL LVL3 (GOWN DISPOSABLE) ×1 IMPLANT
GOWN STRL REUS W/TWL 2XL LVL3 (GOWN DISPOSABLE) ×3 IMPLANT
GOWN STRL REUS W/TWL LRG LVL3 (GOWN DISPOSABLE)
GOWN STRL REUS W/TWL XL LVL3 (GOWN DISPOSABLE) ×2
HEMOSTAT POWDER KIT SURGIFOAM (HEMOSTASIS) ×3 IMPLANT
KIT BASIN OR (CUSTOM PROCEDURE TRAY) ×3 IMPLANT
KIT TURNOVER KIT B (KITS) ×3 IMPLANT
NEEDLE HYPO 22GX1.5 SAFETY (NEEDLE) ×3 IMPLANT
NEEDLE SPNL 22GX3.5 QUINCKE BK (NEEDLE) ×3 IMPLANT
NS IRRIG 1000ML POUR BTL (IV SOLUTION) ×3 IMPLANT
PACK LAMINECTOMY NEURO (CUSTOM PROCEDURE TRAY) ×3 IMPLANT
PAD ARMBOARD 7.5X6 YLW CONV (MISCELLANEOUS) ×12 IMPLANT
PATTIES SURGICAL .5 X1 (DISPOSABLE) ×3 IMPLANT
RUBBERBAND STERILE (MISCELLANEOUS) ×6 IMPLANT
SPONGE INTESTINAL PEANUT (DISPOSABLE) ×3 IMPLANT
SUT VIC AB 4-0 RB1 18 (SUTURE) ×6 IMPLANT
TAPE CLOTH 4X10 WHT NS (GAUZE/BANDAGES/DRESSINGS) IMPLANT
TOWEL GREEN STERILE (TOWEL DISPOSABLE) ×3 IMPLANT
TOWEL GREEN STERILE FF (TOWEL DISPOSABLE) ×3 IMPLANT
WATER STERILE IRR 1000ML POUR (IV SOLUTION) ×3 IMPLANT

## 2019-01-01 NOTE — Anesthesia Procedure Notes (Signed)
Procedure Name: Intubation Date/Time: 01/01/2019 7:40 AM Performed by: Renato Shin, CRNA Pre-anesthesia Checklist: Patient identified, Emergency Drugs available and Suction available Patient Re-evaluated:Patient Re-evaluated prior to induction Oxygen Delivery Method: Circle system utilized Preoxygenation: Pre-oxygenation with 100% oxygen Induction Type: IV induction Ventilation: Oral airway inserted - appropriate to patient size and Mask ventilation without difficulty Laryngoscope Size: Glidescope and 4 Grade View: Grade I Tube type: Oral Tube size: 7.5 mm Number of attempts: 1 Airway Equipment and Method: Video-laryngoscopy and Rigid stylet Placement Confirmation: ETT inserted through vocal cords under direct vision,  positive ETCO2 and breath sounds checked- equal and bilateral Secured at: 21 cm Tube secured with: Tape Dental Injury: Teeth and Oropharynx as per pre-operative assessment

## 2019-01-01 NOTE — Discharge Summary (Signed)
Physician Discharge Summary  Patient ID: Brian Rollins MRN: [id number] DOB/AGE: Sep 09, 1956 63 y.o.  Admit date: 01/01/2019 Discharge date: 01/01/2019  Admission Diagnoses: Cervical spondylosis and herniated nucleus pulposus C4-5 with right cervical radiculopathy  Discharge Diagnoses: Cervical spondylosis and herniated nucleus pulposus C4-5 with right cervical radiculopathy Active Problems:   Cervical radiculopathy at C5   Discharged Condition: good  Hospital Course: She was admitted to undergo surgical decompression at C4-5 which she tolerated well  Consults: None  Significant Diagnostic Studies: None  Treatments: surgery: Anterior cervical decompression C4-5 with arthroplasty using Mo BC device  Discharge Exam: Blood pressure 137/90, pulse 82, temperature 97.9 F (36.6 C), temperature source Oral, resp. rate 16, height 5\' 5"  (1.651 m), weight 84.8 kg, SpO2 96 %. Incision is clean and dry and motor function is intact Station and gait are normal  Disposition: Discharge disposition: 01-Home or Self Care       Discharge Instructions    Call MD for:  redness, tenderness, or signs of infection (pain, swelling, redness, odor or green/yellow discharge around incision site)   Complete by:  As directed    Call MD for:  severe uncontrolled pain   Complete by:  As directed    Call MD for:  temperature >100.4   Complete by:  As directed    Diet - low sodium heart healthy   Complete by:  As directed    Discharge instructions   Complete by:  As directed    Okay to shower. Do not apply salves or appointments to incision. No heavy lifting with the upper extremities greater than 15 pounds. May resume driving when not requiring pain medication and patient feels comfortable with doing so.   Incentive spirometry RT   Complete by:  As directed    Increase activity slowly   Complete by:  As directed      Allergies as of 01/01/2019      Reactions   M[place] Other (See Comments)   Unknown  reaction      Medication List    TAKE these medications   aspirin EC 81 MG tablet Take 81 mg by mouth daily.   benazepril 20 MG tablet Commonly known as:  LOTENSIN Take 20 mg by mouth daily at 6 PM. Takes to protect kidneys   clopidogrel 75 MG tablet Commonly known as:  PLAVIX Take 1 tablet (75 mg total) by mouth daily.   diazepam 5 MG tablet Commonly known as:  VALIUM Take 1 tablet (5 mg total) by mouth every 6 (six) hours as needed for muscle spasms.   FARXIGA 10 MG Tabs tablet Generic drug:  dapagliflozin propanediol Take 10 mg by mouth daily.   gabapentin 300 MG capsule Commonly known as:  NEURONTIN Take 300 mg by mouth daily at 6 PM.   JANUMET XR 50-1000 MG Tb24 Generic drug:  SitaGLIPtin-MetFORMIN HCl Take 2 tablets by mouth daily.   metoprolol succinate 100 MG 24 hr tablet Commonly known as:  TOPROL-XL TAKE ONE TABLET BY MOUTH DAILY WITH OR IMMEDIATELY FOLLOWING A MEAL What changed:  See the new instructions.   nitroGLYCERIN 0.4 MG SL tablet Commonly known as:  NITROSTAT Place 1 tablet (0.4 mg total) under the tongue every 5 (five) minutes as needed for chest pain (no more than 3 doses).   oxyCODONE-acetaminophen 5-325 MG tablet Commonly known as:  PERCOCET/ROXICET Take 1-2 tablets by mouth every 3 (three) hours as needed for moderate pain or severe pain.   pantoprazole 40 MG tablet Commonly known  as:  PROTONIX Take 40 mg by mouth daily.   simvastatin 40 MG tablet Commonly known as:  ZOCOR Take 40 mg by mouth daily at 6 PM.   tamsulosin 0.4 MG Caps capsule Commonly known as:  FLOMAX Take 0.8 mg by mouth daily at 6 PM.        Signed: Blanchie Dessert Esteban Kobashigawa 01/01/2019, 3:51 PM

## 2019-01-01 NOTE — Progress Notes (Signed)
Patient alert and oriented, mae's well, voiding adequate amount of urine, swallowing without difficulty, no c/o pain at time of discharge. Patient discharged home with family. Script sent to pharmacy and  discharged instructions given to patient. Patient and family stated understanding of instructions given. Patient has an appointment with Dr. Ellene Route

## 2019-01-01 NOTE — Evaluation (Signed)
Physical Therapy Evaluation Patient Details Name: Brian Rollins MRN: [id number] DOB: 28-Aug-1956 Today's Date: 01/01/2019   History of Present Illness  Patient is a 63 y/o male who presents s/p C4-5 Anterior cervical decompression and arthroplasty. PMH includes HTN, OSA, DM.  Clinical Impression  Patient presents with pain s/p above surgery. Patient performed bed mobility, transfers and gait training Mod I-independent. Tolerated stair training independently without difficulty. Reports some discomfort with RUE on lateral aspect of shoulder, esp when reaching behind back. Education re: log roll technique, cervical precautions, activity. Pt independent and working as Tax inspector. Has supportive wife. Would like to get back to playing golf. Would benefit from OPPT when cleared by MD to improve RUE pain and function so pt can return to activities. Pt does not require skilled therapy services. Discharge from therapy.     Follow Up Recommendations Follow surgeon's recommendation for DC plan and follow-up therapies    Equipment Recommendations  None recommended by PT    Recommendations for Other Services       Precautions / Restrictions Precautions Precautions: Cervical Precaution Booklet Issued: No Precaution Comments: Reviewed cervical precautions. Restrictions Weight Bearing Restrictions: No      Mobility  Bed Mobility Overal bed mobility: Needs Assistance Bed Mobility: Rolling;Sidelying to Sit Rolling: Modified independent (Device/Increase time) Sidelying to sit: Modified independent (Device/Increase time)       General bed mobility comments: HOB flat, no rail to simulate home, cues for log roll technique.  Transfers Overall transfer level: Independent Equipment used: None             General transfer comment: Stood from EOB x2, transferred to chair post ambulation.  Ambulation/Gait Ambulation/Gait assistance: Independent Gait Distance (Feet): 400 Feet Assistive  device: None Gait Pattern/deviations: Step-through pattern;Decreased stride length   Gait velocity interpretation: >4.37 ft/sec, indicative of normal walking speed General Gait Details: Steady gait. No LOB or increased pain.   Stairs Stairs: Yes Stairs assistance: Independent Stair Management: Alternating pattern Number of Stairs: 3 General stair comments: Cues for safety.   Wheelchair Mobility    Modified Rankin (Stroke Patients Only)       Balance Overall balance assessment: No apparent balance deficits (not formally assessed)                                           Pertinent Vitals/Pain Pain Assessment: Faces Faces Pain Scale: Hurts a little bit Pain Location: right shoulder with movement Pain Descriptors / Indicators: Discomfort Pain Intervention(s): Monitored during session    Home Living Family/patient expects to be discharged to:: Private residence Living Arrangements: Spouse/significant other Available Help at Discharge: Family;Available PRN/intermittently Type of Home: House Home Access: Stairs to enter   Entrance Stairs-Number of Steps: 2 Home Layout: Two level;Able to live on main level with bedroom/bathroom Home Equipment: Clinical cytogeneticist - 2 wheels      Prior Function Level of Independence: Independent         Comments: Works as Biochemist, clinical, 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     Hand Dominance   Dominant Hand: Right    Extremity/Trunk Assessment   Upper Extremity Assessment Upper Extremity Assessment: Defer to OT evaluation;RUE deficits/detail RUE Deficits / Details: Limited IR, adduction and extension due to discomfort RUE Sensation: WNL    Lower Extremity Assessment Lower Extremity Assessment: Overall WFL for tasks assessed    Cervical / Trunk  Assessment Cervical / Trunk Assessment: Other exceptions Cervical / Trunk Exceptions: s/p cervical surgery  Communication   Communication: No difficulties   Cognition Arousal/Alertness: Awake/alert Behavior During Therapy: WFL for tasks assessed/performed Overall Cognitive Status: Within Functional Limits for tasks assessed                                        General Comments General comments (skin integrity, edema, etc.): Wife present during session.    Exercises     Assessment/Plan    PT Assessment Patent does not need any further PT services  PT Problem List         PT Treatment Interventions      PT Goals (Current goals can be found in the Care Plan section)  Acute Rehab PT Goals Patient Stated Goal: to get back to work and golf PT Goal Formulation: All assessment and education complete, DC therapy    Frequency     Barriers to discharge        Co-evaluation               AM-PAC PT "6 Clicks" Mobility  Outcome Measure Help needed turning from your back to your side while in a flat bed without using bedrails?: None Help needed moving from lying on your back to sitting on the side of a flat bed without using bedrails?: None Help needed moving to and from a bed to a chair (including a wheelchair)?: None Help needed standing up from a chair using your arms (e.g., wheelchair or bedside chair)?: None Help needed to walk in hospital room?: None Help needed climbing 3-5 steps with a railing? : None 6 Click Score: 24    End of Session Equipment Utilized During Treatment: Gait belt Activity Tolerance: Patient tolerated treatment well Patient left: in chair;with call bell/phone within reach;with family/visitor present Nurse Communication: Mobility status PT Visit Diagnosis: Pain;Difficulty in walking, not elsewhere classified (R26.2) Pain - part of body: (neck)    Time: 6333-5456 PT Time Calculation (min) (ACUTE ONLY): 24 min   Charges:   PT Evaluation $PT Eval Low Complexity: 1 Low PT Treatments $Gait Training: 8-22 mins        Wray Kearns, PT, DPT Acute Rehabilitation  Services Pager [phone] Office [place] 01/01/2019, 1:17 PM

## 2019-01-01 NOTE — H&P (Addendum)
CHIEF COMPLAINT: Pain in the neck, right arm and right shoulder for about a year.  HISTORY OF PRESENT ILLNESS: [name] is a 63 year old right-handed individual who is seen now for evaluation of neck, shoulder and right arm pain that he has noted for about a year's period of time.  In March of this year, he has been seen and treated by Dr. Delilah Shan and ultimately has had physical therapy, a course of oral Prednisone and some passage of time to see if the symptoms would gradually resolve, but despite this, he has had worsening symptoms in the right shoulder and right arm.  He underwent an MRI of the shoulder, which appears normal grossly and he also had an MRI of the cervical spine.  This study is reviewed in the office and it demonstrates that the patient has primarily the most significant spondylitic degeneration with foraminal stenosis on the right side at C4-C5.  This effaces the ventral aspect of his cord on that right side also, but it causes significant narrowing for the C5 nerve root on that right side.  The other levels actually appear fairly stable.  He does have some mild to moderate spondylitic degeneration at C3-4, the level above, but the foramen all appear adequately open throughout the rest of the spinal canal on that right side.  Plain x-rays were not included and today in the office, I obtained a lateral flexion-extension cervical spine film that demonstrates that the patient has good motion between flexion and extension.  There is collapse of the disc height at the C4-C5 level, particularly posteriorly where it appears there is some bone rubbing on bone.  The alignment in the coronal plane on the AP view is also normal.  PAST MEDICAL HISTORY: Reveals that his general health has been good.  He does have some high blood pressure and some type 2 diabetes.  He notes that he has had some recent weight loss and this has helped to control those situations.  CURRENT MEDICATION LIST: Includes  Janumet, Metoprolol, Farxiga, Pantoprazole, Brilinta, Aspirin, Tamsulosin, Simvastatin, Benazepril, and Gabapentin.  He notes that he had a coronary stenting this past July in addition to an angioplasty.  This was done by Dr. Pernell Dupre and he is currently on Brilinta for that process.  PREVIOUS SURGERIES: Include an ACL reconstruction on the right and some left knee repair.  SYSTEMS REVIEW: Notable for the 2 items in the History of Present Illness, that is the neck pain and the arm weakness on the right side.  PHYSICAL EXAMINATION: I note that he demonstrates a good range of motion to his neck turning casually, left and right and flexing and extending normally.  There are no masses noted in the supraclavicular fossa and there is no tenderness noted.  His motor function appears good in the deltoids, biceps, triceps, wrist extensor and grip, save for the right side where he does seem to demonstrate some modest weakness in the anterior portion of the deltoid.  Internal rotation at the shoulder does tend to create some pain in the anterior deltoid and also in the biceps.  IMPRESSION: 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  He has had a fairly comprehensive course of conservative management.  I noted that given the difficulties that he has been having for the length of time that he has been having them  and the poor response to conservative management including physical therapy, I believe that he would be an excellent candidate for a 1 level decompression and arthroplasty at the level of C4-C5.  I demonstrated the arthroplasty on a model for him. Of concern is the fact that he is on Brilinta and we would need to know from Dr. Tamala Julian when would it be safe to discontinue the Brilinta to undergo surgical intervention.  We will need to ascertain this from Dr.  Tamala Julian himself.  Based on his response, we will plan on scheduling the surgery at his earliest convenience.  I did note that the surgery typically is done on an outpatient basis and the relief that he should see would hopefully be prompt though nerve root irritability, if it has been going on for some time, can sometimes be a bit refractory.

## 2019-01-01 NOTE — Op Note (Signed)
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 Surgeon: Kristeen Miss First assistant: Newman Pies, MD Anesthesia: General endotracheal Indications: Brian Rollins is a 63 year old individual who has had significant neck shoulder and right arm pain.  He has evidence of advanced spondylosis with lateral recess stenosis on the right side secondary to a small herniated nucleus pulposus in this region he was advised regarding the need for surgical decompression and arthroplasty at C4-5.  Procedure: Patient was brought to the operating room supine on the stretcher after the smooth induction of general endotracheal anesthesia he was placed in a donut headrest with his neck slightly extended.  A transverse incision was created in the left-sided neck after the prepping and draping with alcohol DuraPrep.  The dissection was carried down through the platysma and the plane between the sternocleidomastoid and strap muscles was dissected bluntly into the first prevertebral space was reached.  The space was identified positively with fluoroscopy at C4-5.  A discectomy was then performed after self-retaining retractors were placed into the wound.  The lateral recess on the right side was then decompressed of significant osteophytic overgrowth and a small disc herniation in the lateral recess.  Once this was removed the opposite side was cleared in a similar fashion and hemostasis was achieved in the epidural spaces using Surgifoam and cottonoid patties which were later irrigated away.  The interspace was then sized for appropriate size spacer and was felt that a 17 mm wide 13 mm deep 5 mm tall arthroplasty device would fit best on fluoroscopic imaging this was then placed into the  interspace and tamped into final resting position.  Final radiographs were obtained in AP and lateral projection demonstrating good placement of the arthroplasty.  Hemostasis was then doubly checked in the prevertebral space and the surrounding soft tissues and once verified the platysma was closed with 3-0 Vicryl interrupted fashion 4-0 Vicryl was used in a subcuticular tissues Dermabond was placed on the skin blood loss for the procedure was estimated at 75 cc.

## 2019-01-01 NOTE — Progress Notes (Signed)
Inpatient Diabetes Program Recommendations  AACE/ADA: New Consensus Statement on Inpatient Glycemic Control (2015)  Target Ranges:  Prepandial:   less than 140 mg/dL      Peak postprandial:   less than 180 mg/dL (1-2 hours)      Critically ill patients:  140 - 180 mg/dL   Lab Results  Component Value Date   GLUCAP 292 (H) 01/01/2019   HGBA1C 8.9 (H) 12/25/2018   DM 2  Spoke to patient prior to discharge ( he just got dressed and ready to leave now) regarding his Hgb A1c of 8.9%. This is an average of 209 mg/dl. Patient has an MD who manages his diabetes/orders meds. He checks his blood glucose at home. Stated it had been in the 7% range but he "hasn't been eating as well as he should have" lately. He is aware of current Hgb A1c and will follow up with PCP.  -- Will follow during hospitalization.--  Jonna Clark RN, MSN Diabetes Coordinator Inpatient Glycemic Control Team Team Pager: [phone] (8am-5pm)

## 2019-01-01 NOTE — Anesthesia Postprocedure Evaluation (Signed)
Anesthesia Post Note  Patient: Brian Rollins  Procedure(s) Performed: Cervical Four-Five Artificial disc replacement (N/A Spine Cervical)     Patient location during evaluation: PACU Anesthesia Type: General Level of consciousness: awake and alert Pain management: pain level controlled Vital Signs Assessment: post-procedure vital signs reviewed and stable Respiratory status: spontaneous breathing, nonlabored ventilation, respiratory function stable and patient connected to nasal cannula oxygen Cardiovascular status: blood pressure returned to baseline and stable Postop Assessment: no apparent nausea or vomiting Anesthetic complications: no    Last Vitals:  Vitals:   01/01/19 1000 01/01/19 1010  BP:  132/76  Pulse: 82 87  Resp: 14 (!) 21  Temp:    SpO2: 98% 92%    Last Pain:  Vitals:   01/01/19 0945  TempSrc:   PainSc: Asleep                 Darell Saputo,W. EDMOND

## 2019-01-01 NOTE — Transfer of Care (Signed)
Immediate Anesthesia Transfer of Care Note  Patient: Brian Rollins  Procedure(s) Performed: Cervical Four-Five Artificial disc replacement (N/A Spine Cervical)  Patient Location: PACU  Anesthesia Type:General  Level of Consciousness: drowsy and patient cooperative  Airway & Oxygen Therapy: Patient Spontanous Breathing and Patient connected to nasal cannula oxygen  Post-op Assessment: Report given to RN, Post -op Vital signs reviewed and stable, Patient moving all extremities X 4 and Patient able to stick tongue midline  Post vital signs: Reviewed and stable  Last Vitals:  Vitals Value Taken Time  BP    Temp    Pulse    Resp    SpO2      Last Pain:  Vitals:   01/01/19 0628  TempSrc:   PainSc: 4          Complications: No apparent anesthesia complications

## 2019-01-02 ENCOUNTER — Encounter (HOSPITAL_COMMUNITY): Payer: Self-pay | Admitting: Neurological Surgery

## 2019-01-18 ENCOUNTER — Other Ambulatory Visit (HOSPITAL_COMMUNITY): Payer: Self-pay | Admitting: Medical

## 2019-01-24 DIAGNOSIS — M4722 Other spondylosis with radiculopathy, cervical region: Secondary | ICD-10-CM | POA: Diagnosis not present

## 2019-01-24 DIAGNOSIS — I10 Essential (primary) hypertension: Secondary | ICD-10-CM | POA: Diagnosis not present

## 2019-01-24 DIAGNOSIS — Z6830 Body mass index (BMI) 30.0-30.9, adult: Secondary | ICD-10-CM | POA: Diagnosis not present

## 2019-02-18 ENCOUNTER — Other Ambulatory Visit: Payer: Self-pay | Admitting: Medical

## 2019-02-21 IMAGING — MR MR HEAD W/O CM
9 of 10 series · 34 of 48 positions shown · non-contrast
Comparison: CT HEAD April 18, 2017 at 6665 hours

CLINICAL DATA: Peripheral vision changes beginning this morning,
dizziness and headache. History of diabetes.

EXAM:
MRI HEAD WITHOUT CONTRAST
TECHNIQUE: Multiplanar, multiecho pulse sequences of the brain and surrounding
structures were obtained without intravenous contrast.

[Series 3: DWI · axial · 3.0mm · 0.94mm/px · z∈[-9,+135]mm · 8 of 100 slices shown (1 of 2)]
[im 1/100]
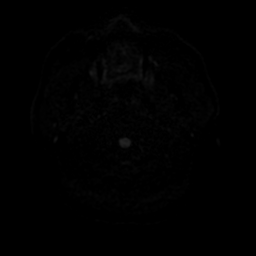
[im 12/100]
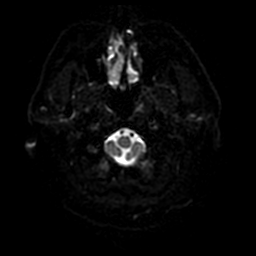
[im 34/100]
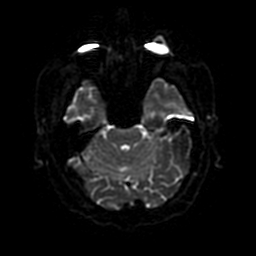
[im 45/100]
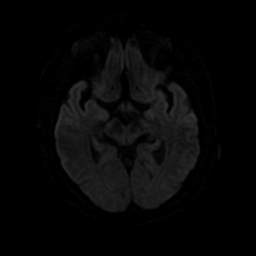
[im 56/100]
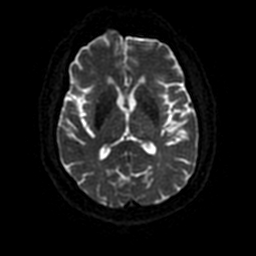
[im 67/100]
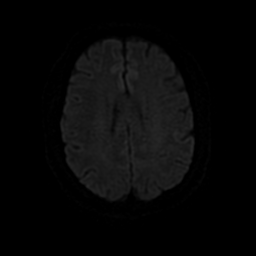
[im 89/100]
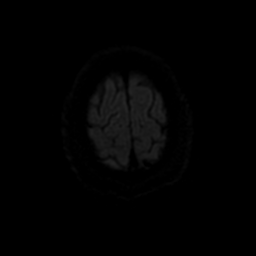
[im 100/100]
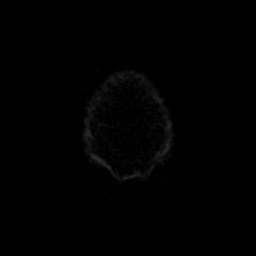

[Series 4: FLAIR · sagittal · 5.0mm · 0.47mm/px · 2 of 23 slices shown (1 of 2)]
[im 1/23]
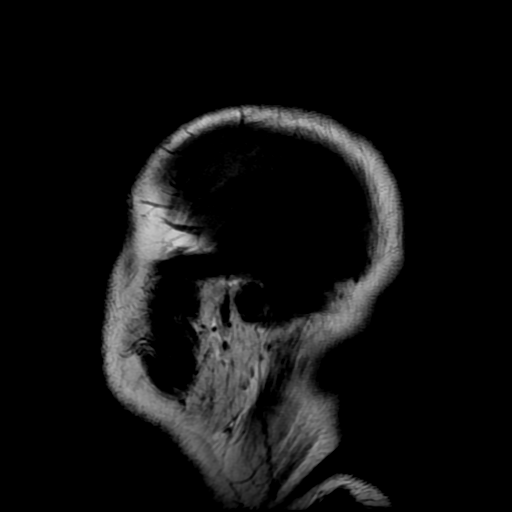
[im 23/23]
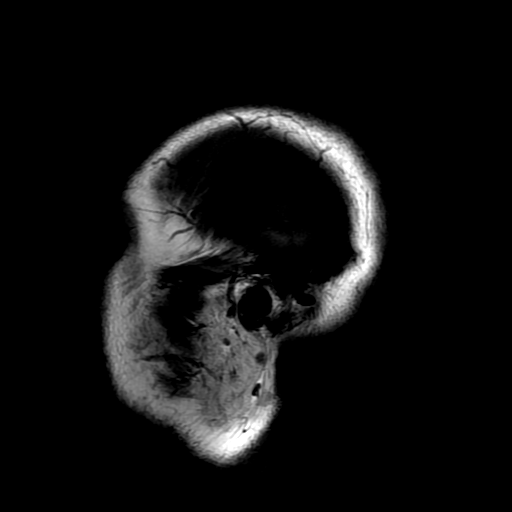

[Series 5: T2 · axial · 5.0mm · 0.47mm/px · z∈[-5,+140]mm · 2 of 26 slices shown (1 of 2)]
[im 1/26]
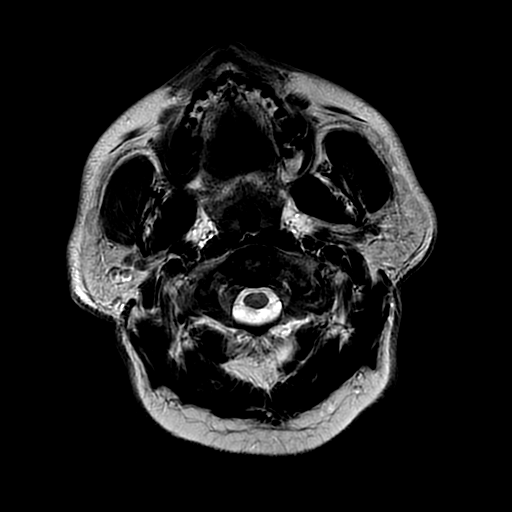
[im 26/26]
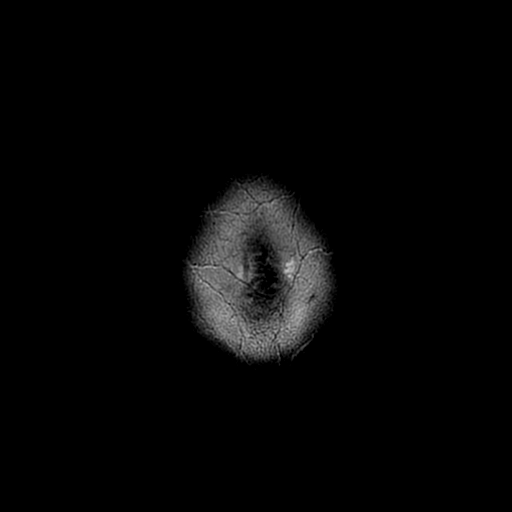

[Series 6: FLAIR · axial · 5.0mm · 0.47mm/px · z∈[-5,+140]mm · 2 of 26 slices shown (2 of 2)]
[im 1/26]
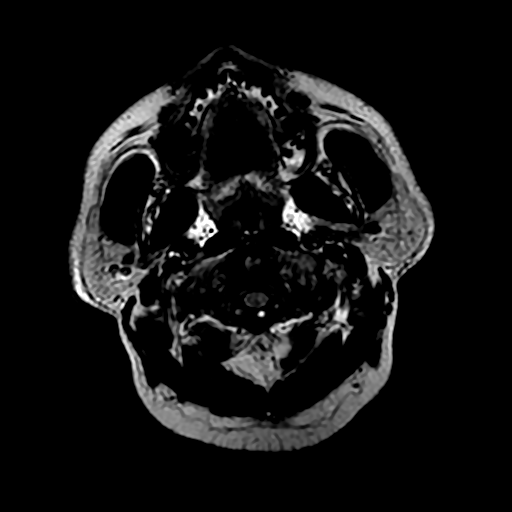
[im 26/26]
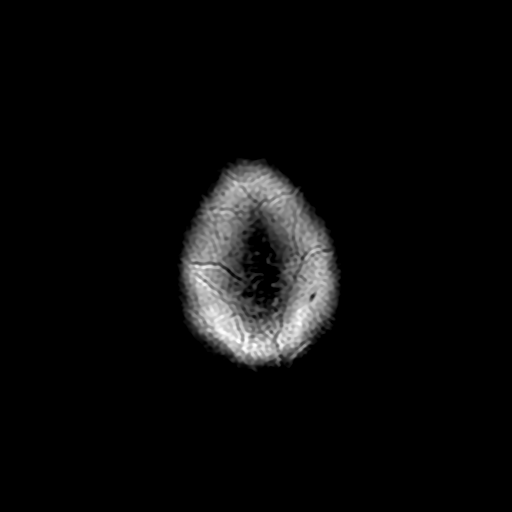

[Series 7: (person_name) · axial · 3.0mm · 0.47mm/px · z∈[-7,+43]mm · 3 of 104 slices shown]
[im 1/104]
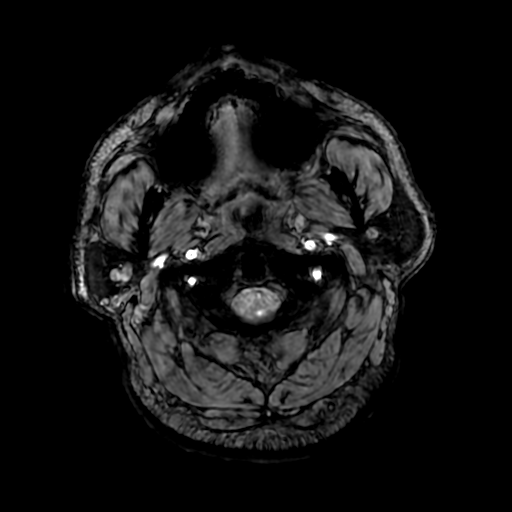
[im 12/104]
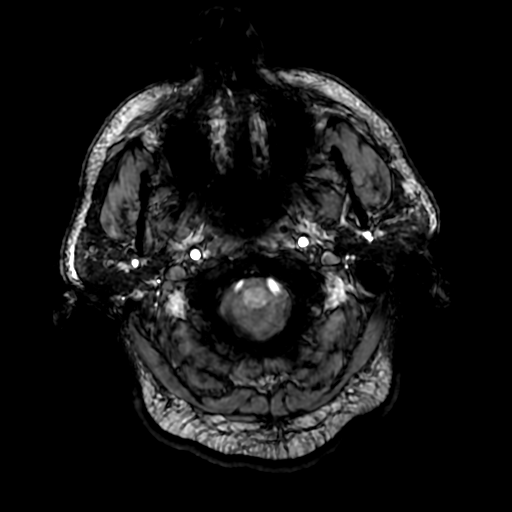
[im 35/104]
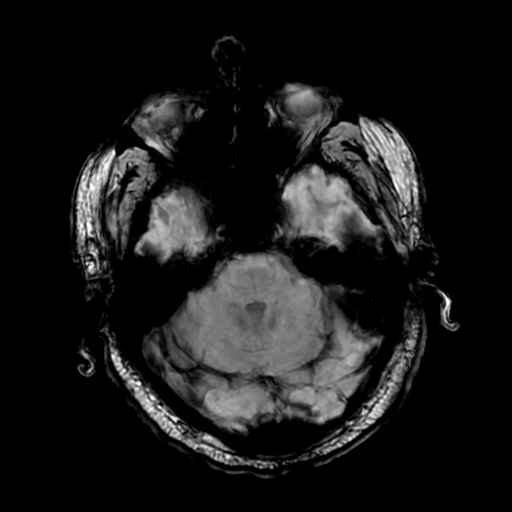

[Series 9: DWI · coronal · 4.0mm · 0.94mm/px · 7 of 72 slices shown (2 of 2)]
[im 1/72]
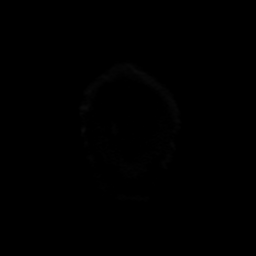
[im 12/72]
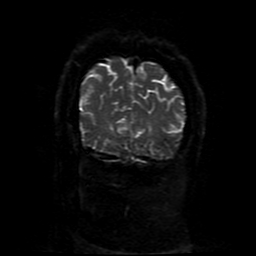
[im 24/72]
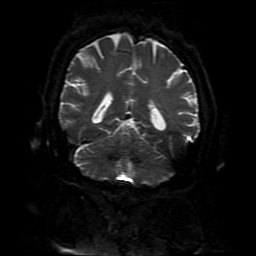
[im 36/72]
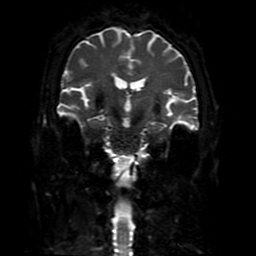
[im 48/72]
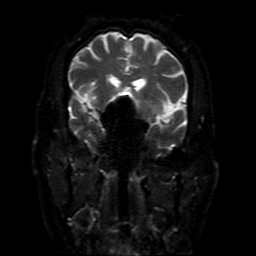
[im 60/72]
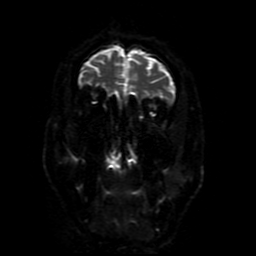
[im 72/72]
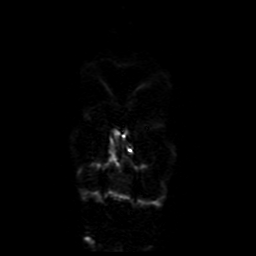

[Series 10: T2 · coronal · 5.0mm · 0.47mm/px · 3 of 28 slices shown (2 of 2)]
[im 1/28]
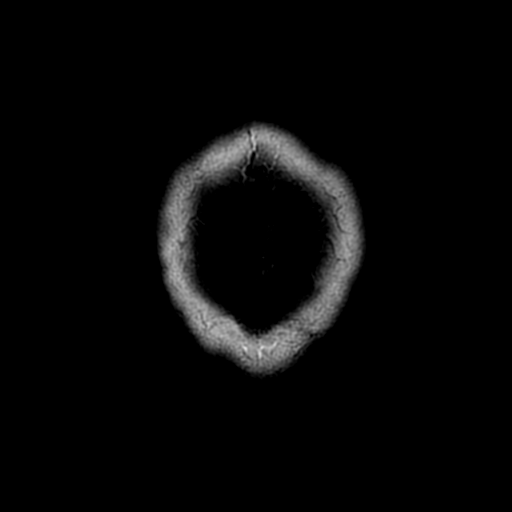
[im 14/28]
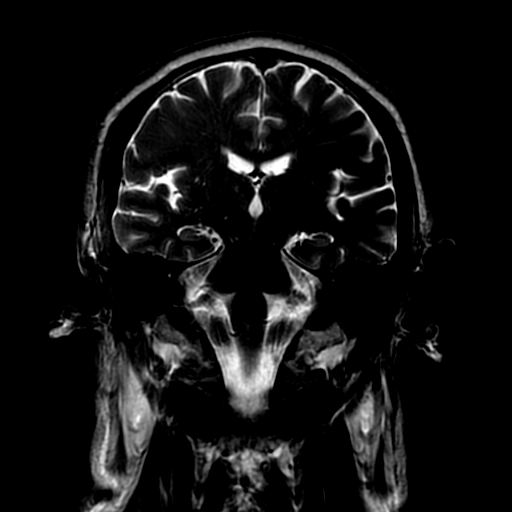
[im 28/28]
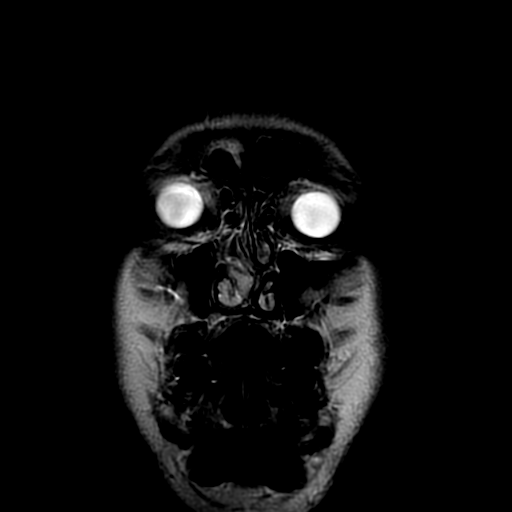

[Series 350: ADC · axial · 3.0mm · 0.94mm/px · z∈[-9,+135]mm · 4 of 46 slices shown (1 of 2)]
[im 1/46]
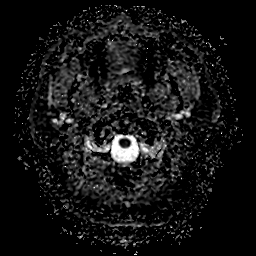
[im 16/46]
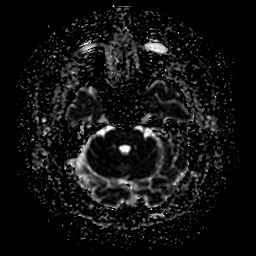
[im 31/46]
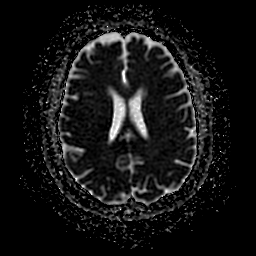
[im 46/46]
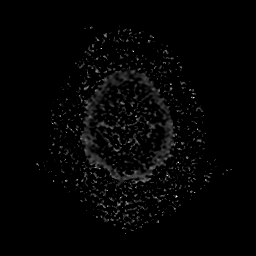

[Series 950: ADC · coronal · 4.0mm · 0.94mm/px · 3 of 36 slices shown (2 of 2)]
[im 1/36]
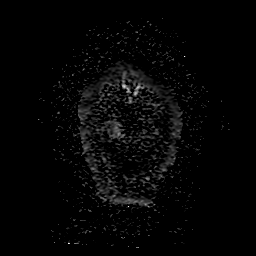
[im 18/36]
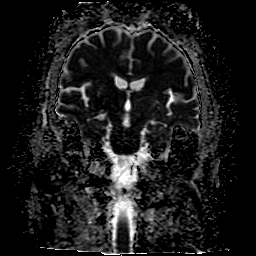
[im 36/36]
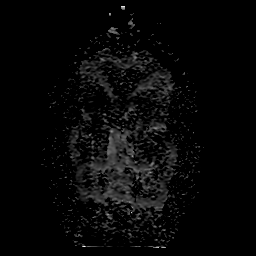

[34 of 48 positions shown; findings below may reference images not displayed]

FINDINGS: BRAIN: No reduced diffusion to suggest acute ischemia. No
susceptibility artifact to suggest hemorrhage. The ventricles and
sulci are normal for patient's age. Scattered subcentimeter
supratentorial white matter T2 hyperintensities. No suspicious
parenchymal signal, masses or mass effect. No abnormal extra-axial
fluid collections.

VASCULAR: Normal major intracranial vascular flow voids present at
skull base.

SKULL AND UPPER CERVICAL SPINE: No abnormal sellar expansion. No
suspicious calvarial bone marrow signal. Craniocervical junction
maintained.

SINUSES/ORBITS: RIGHT maxillary mucosal retention cyst in trace
paranasal sinus mucosal thickening. Mastoid air cells are well
aerated. The included ocular globes and orbital contents are
non-suspicious.

OTHER: None.
IMPRESSION: No acute intracranial process.

Mild white matter changes compatible with chronic small vessel
ischemic disease.

## 2019-02-27 DIAGNOSIS — E1169 Type 2 diabetes mellitus with other specified complication: Secondary | ICD-10-CM | POA: Diagnosis not present

## 2019-03-02 ENCOUNTER — Telehealth: Payer: Self-pay | Admitting: Cardiology

## 2019-03-02 NOTE — Telephone Encounter (Signed)
Left VM with patient regarding upcoming face to face appointment with Dr. Tamala Julian. Left information about completing a telephone or video virtual visit at this time.

## 2019-03-04 ENCOUNTER — Telehealth: Payer: Self-pay | Admitting: Interventional Cardiology

## 2019-03-04 NOTE — Telephone Encounter (Signed)
Okay to push OV back to July which will be 1 year anniversary of since stent.

## 2019-03-06 NOTE — Telephone Encounter (Signed)
Left message to call back  

## 2019-03-08 NOTE — Telephone Encounter (Signed)
Spoke with pt and he is doing fine.  Pt ok with rescheduling to July.  Scheduled him for 7/10.  Advised to call sooner if any issues.

## 2019-03-15 ENCOUNTER — Ambulatory Visit: Payer: Commercial Managed Care - PPO | Admitting: Interventional Cardiology

## 2019-05-19 ENCOUNTER — Other Ambulatory Visit: Payer: Self-pay | Admitting: Cardiovascular Disease

## 2019-05-21 ENCOUNTER — Telehealth: Payer: Self-pay

## 2019-05-21 NOTE — Telephone Encounter (Signed)

## 2019-05-21 NOTE — Progress Notes (Signed)
Cardiology Office Note:    Date:  05/22/2019   ID:  Brian Rollins, DOB 04-12-1956, MRN [id number]  PCP:  Lawerance Cruel, MD  Cardiologist:  Sinclair Grooms, MD   Referring MD: Lawerance Cruel, MD   Chief Complaint  Patient presents with  . Coronary Artery Disease    History of Present Illness:    Brian Rollins is a 63 y.o. male with a hx of coronary artery disease, coronary PCI/stent to mid LAD July 2019, hyperlipidemia, hypertension, obstructive sleep apnea, and type 2 diabetes mellitus.  History of tachycardia.  He has not had any history of chest discomfort since myocardial infarction in July 2019.  He has done really well with exercise and diet.  Weight has come down.  The COVID pandemic has led to a change in diet with incorporation of more carbohydrates because of vascular disease.  The most recent hemoglobin A1c was 7.9.  This was done in March.  He is on SGLT2 agent.  His lipid management includes moderate intensity statin therapy in the form of simvastatin.  Past Medical History:  Diagnosis Date  . Chest discomfort   . Chest pain on exertion   . GERD (gastroesophageal reflux disease)   . High cholesterol   . Hydrocele, left   . Hypertension   . Keratoconus   . OSA (obstructive sleep apnea)    "mild; don't wear mask" (06/27/2018)  . Pneumonia 1980s X 1  . Rapid heart beat   . Type II diabetes mellitus ([place])     Past Surgical History:  Procedure Laterality Date  . CERVICAL DISC ARTHROPLASTY N/A 01/01/2019   Procedure: Cervical Four-Five Artificial disc replacement;  Surgeon: Kristeen Miss, MD;  Location: [facility];  Service: Neurosurgery;  Laterality: N/A;  anterior  . CORONARY ANGIOPLASTY WITH STENT PLACEMENT  06/27/2018  . CORONARY STENT INTERVENTION N/A 06/27/2018   Procedure: CORONARY STENT INTERVENTION;  Surgeon: Belva Crome, MD;  Location: [facility];  Service: Cardiovascular;  Laterality: N/A;  . HYDROCELE EXCISION  12/03/2011   Procedure:  HYDROCELECTOMY ADULT;  Surgeon: Claybon Jabs, MD;  Location: [facility];  Service: Urology;  Laterality: Left;  . KNEE ARTHROSCOPY Bilateral 2000s X 2   "removed scar tissue on the right; repaired torn meniscus"  . KNEE ARTHROSCOPY W/ ACL RECONSTRUCTION Right 1990s  . RIGHT/LEFT HEART CATH AND CORONARY ANGIOGRAPHY N/A 06/27/2018   Procedure: RIGHT/LEFT HEART CATH AND CORONARY ANGIOGRAPHY;  Surgeon: Belva Crome, MD;  Location: [facility];  Service: Cardiovascular;  Laterality: N/A;    Current Medications: Current Meds  Medication Sig  . benazepril (LOTENSIN) 20 MG tablet Take 20 mg by mouth daily at 6 PM. Takes to protect kidneys  . clopidogrel (PLAVIX) 75 MG tablet Take 1 tablet (75 mg total) by mouth daily.  . dapagliflozin propanediol (FARXIGA) 10 MG TABS tablet Take 10 mg by mouth daily.  Marland Kitchen glimepiride (AMARYL) 2 MG tablet Take 2 mg by mouth daily.  . metoprolol succinate (TOPROL-XL) 100 MG 24 hr tablet TAKE ONE TABLET BY MOUTH DAILY WITH OR IMMEDIATELY FOLLOWING A MEAL  . nitroGLYCERIN (NITROSTAT) 0.4 MG SL tablet Place 1 tablet (0.4 mg total) under the tongue every 5 (five) minutes as needed for chest pain (no more than 3 doses).  . pantoprazole (PROTONIX) 40 MG tablet Take 40 mg by mouth daily.  . simvastatin (ZOCOR) 40 MG tablet Take 40 mg by mouth daily at 6 PM.   . SitaGLIPtin-MetFORMIN  HCl (JANUMET XR) 50-1000 MG TB24 Take 2 tablets by mouth daily.   . tamsulosin (FLOMAX) 0.4 MG CAPS capsule Take 0.8 mg by mouth daily at 6 PM.   . [DISCONTINUED] aspirin EC 81 MG tablet Take 81 mg by mouth daily.     Allergies:   Mercury   Social History   Socioeconomic History  . Marital status: Married    Spouse name: donna  . Number of children: 2  . Years of education: college  . Highest education level: Not on file  Occupational History  . 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 Financial resource strain: Not on file  . Food insecurity    Worry:  Not on file    Inability: Not on file  . Transportation needs    Medical: Not on file    Non-medical: Not on file  Tobacco Use  . Smoking status: Former Smoker    Packs/day: 0.50    Years: 3.00    Pack years: 1.50    Types: Cigarettes  . Smokeless tobacco: Former Systems developer  . Tobacco comment: 06/27/2018 "quit smoking & using chew  in the early 1980s"  Substance and Sexual Activity  . Alcohol use: Yes    Alcohol/week: 0.0 standard drinks    Comment: 06/27/2018 "might drink once/month"  . Drug use: Never  . Sexual activity: Not Currently  Lifestyle  . Physical activity    Days per week: Not on file    Minutes per session: Not on file  . Stress: Not on file  Relationships  . Social Herbalist on phone: Not on file    Gets together: Not on file    Attends religious service: Not on file    Active member of club or organization: Not on file    Attends meetings of clubs or organizations: Not on file    Relationship status: Not on file  Other Topics Concern  . Not on file  Social History Narrative  . Not on file     Family History: The patient's He was adopted. Family history is unknown by patient.  ROS:   Please see the history of present illness.    Somewhat depressed.  All other systems reviewed and are negative.  EKGs/Labs/Other Studies Reviewed:    The following studies were reviewed today: Cardiac cath and PCI in July 2019: Diagnostic Dominance: Right  Intervention     EKG:  EKG performed 05/22/2019 demonstrates right bundle left axis deviation, and heart rate of 90 bpm in sinus rhythm.  When compared to prior changes occurred.  Recent Labs: 12/25/2018: BUN 16; Creatinine, Ser 0.95; Hemoglobin 14.2; Platelets 197; Potassium 4.0; Sodium 138  Recent Lipid Panel    Component Value Date/Time   CHOL 123 06/26/2018 0945   TRIG 221 (H) 06/26/2018 0945   HDL 33 (L) 06/26/2018 0945   CHOLHDL 3.7 06/26/2018 0945   LDLCALC 46 06/26/2018 0945    Physical  Exam:    VS:  BP 134/78   Pulse 90   Ht 5\' 5"  (1.651 m)   Wt 200 lb (90.7 kg)   SpO2 96%   BMI 33.28 kg/m     Wt Readings from Last 3 Encounters:  05/22/19 200 lb (90.7 kg)  01/01/19 187 lb (84.8 kg)  12/25/18 187 lb 9.6 oz (85.1 kg)     GEN: Slightly overweight.. No acute distress HEENT: Normal NECK: No JVD. LYMPHATICS: No lymphadenopathy CARDIAC: RRR.  No murmur, no gallop,  no edema VASCULAR: 2+ bilateral radial pulses, no bruits RESPIRATORY:  Clear to auscultation without rales, wheezing or rhonchi  ABDOMEN: Soft, non-tender, non-distended, No pulsatile mass, MUSCULOSKELETAL: No deformity  SKIN: Warm and dry NEUROLOGIC:  Alert and oriented x 3 PSYCHIATRIC:  Normal affect   ASSESSMENT:    1. Coronary artery disease involving native coronary artery of native heart with angina pectoris ([place])   2. Mixed hyperlipidemia   3. Essential hypertension   4. Type 2 diabetes mellitus with hyperosmolarity without coma, without long-term current use of insulin (HCC)   5. Educated About Covid-19 Virus Infection    PLAN:    In order of problems listed above:  1. Secondary prevention discussed.  Needs to increase aerobic activity and decrease weight to allow A1c to come under 7. 2. Lipid panel will be done within the next several weeks. 3. Low-salt diet discussed. 4. He is on Farxiga 10 mg daily.  A1c is still greater than 7 and requires increased physical activity and diet control. 5. Social distancing, fascial/mass, handwashing discussed.  Overall education and awareness concerning primary/secondary risk prevention was discussed in detail: LDL less than 70, hemoglobin A1c less than 7, blood pressure target less than 130/80 mmHg, >150 minutes of moderate aerobic activity per week, avoidance of smoking, weight control (via diet and exercise), and continued surveillance/management of/for obstructive sleep apnea.    Medication Adjustments/Labs and Tests Ordered: Current medicines  are reviewed at length with the patient today.  Concerns regarding medicines are outlined above.  Orders Placed This Encounter  Procedures  . Lipid panel  . CBC  . Basic metabolic panel  . Hepatic function panel  . EKG 12-Lead   No orders of the defined types were placed in this encounter.   Patient Instructions  Medication Instructions:  1) DISCONTINUE Aspirin If you need a refill on your cardiac medications before your next appointment, please call your pharmacy.   Lab work: Your physician recommends that you return for lab work sometime this summer (BMET, Lipid, Liver, CBC).  You will need to come fasting for these labs (nothing to eat or drink after midnight except water and black coffee).  If you have labs (blood work) drawn today and your tests are completely normal, you will receive your results only by: Marland Kitchen MyChart Message (if you have MyChart) OR . A paper copy in the mail If you have any lab test that is abnormal or we need to change your treatment, we will call you to review the results.  Testing/Procedures: None  Follow-Up: At [facility], you and your health needs are our priority.  As part of our continuing mission to provide you with exceptional heart care, we have created designated Provider Care Teams.  These Care Teams include your primary Cardiologist (physician) and Advanced Practice Providers (APPs -  Physician Assistants and Nurse Practitioners) who all work together to provide you with the care you need, when you need it. You will need a follow up appointment in 12 months.  Please call our office 2 months in advance to schedule this appointment.  You may see Dr. Tamala Julian or one of the following Advanced Practice Providers on your designated Care Team:   Truitt Merle, NP Cecilie Kicks, NP . Kathyrn Drown, NP  Any Other Special Instructions Will Be Listed Below (If Applicable).       Signed, Sinclair Grooms, MD  05/22/2019 3:57 PM    [place]

## 2019-05-22 ENCOUNTER — Encounter: Payer: Self-pay | Admitting: Interventional Cardiology

## 2019-05-22 ENCOUNTER — Other Ambulatory Visit: Payer: Self-pay

## 2019-05-22 ENCOUNTER — Ambulatory Visit: Payer: Commercial Managed Care - PPO | Admitting: Interventional Cardiology

## 2019-05-22 VITALS — BP 134/78 | HR 90 | Ht 65.0 in | Wt 200.0 lb

## 2019-05-22 DIAGNOSIS — I25119 Atherosclerotic heart disease of native coronary artery with unspecified angina pectoris: Secondary | ICD-10-CM | POA: Diagnosis not present

## 2019-05-22 DIAGNOSIS — E782 Mixed hyperlipidemia: Secondary | ICD-10-CM | POA: Diagnosis not present

## 2019-05-22 DIAGNOSIS — E1100 Type 2 diabetes mellitus with hyperosmolarity without nonketotic hyperglycemic-hyperosmolar coma (NKHHC): Secondary | ICD-10-CM

## 2019-05-22 DIAGNOSIS — I10 Essential (primary) hypertension: Secondary | ICD-10-CM

## 2019-05-22 DIAGNOSIS — Z7189 Other specified counseling: Secondary | ICD-10-CM

## 2019-05-22 NOTE — Patient Instructions (Signed)
Medication Instructions:  1) DISCONTINUE Aspirin If you need a refill on your cardiac medications before your next appointment, please call your pharmacy.   Lab work: Your physician recommends that you return for lab work sometime this summer (BMET, Lipid, Liver, CBC).  You will need to come fasting for these labs (nothing to eat or drink after midnight except water and black coffee).  If you have labs (blood work) drawn today and your tests are completely normal, you will receive your results only by: Marland Kitchen MyChart Message (if you have MyChart) OR . A paper copy in the mail If you have any lab test that is abnormal or we need to change your treatment, we will call you to review the results.  Testing/Procedures: None  Follow-Up: At [facility], you and your health needs are our priority.  As part of our continuing mission to provide you with exceptional heart care, we have created designated Provider Care Teams.  These Care Teams include your primary Cardiologist (physician) and Advanced Practice Providers (APPs -  Physician Assistants and Nurse Practitioners) who all work together to provide you with the care you need, when you need it. You will need a follow up appointment in 12 months.  Please call our office 2 months in advance to schedule this appointment.  You may see Dr. Tamala Julian or one of the following Advanced Practice Providers on your designated Care Team:   Truitt Merle, NP Cecilie Kicks, NP . Kathyrn Drown, NP  Any Other Special Instructions Will Be Listed Below (If Applicable).

## 2019-06-08 ENCOUNTER — Ambulatory Visit: Payer: Commercial Managed Care - PPO | Admitting: Interventional Cardiology

## 2019-06-20 ENCOUNTER — Other Ambulatory Visit: Payer: Self-pay

## 2019-06-20 ENCOUNTER — Other Ambulatory Visit: Payer: Commercial Managed Care - PPO | Admitting: *Deleted

## 2019-06-20 DIAGNOSIS — I25119 Atherosclerotic heart disease of native coronary artery with unspecified angina pectoris: Secondary | ICD-10-CM

## 2019-06-20 DIAGNOSIS — E782 Mixed hyperlipidemia: Secondary | ICD-10-CM

## 2019-06-20 DIAGNOSIS — I10 Essential (primary) hypertension: Secondary | ICD-10-CM

## 2019-06-20 LAB — HEPATIC FUNCTION PANEL
ALT: 30 IU/L (ref 0–44)
AST: 26 IU/L (ref 0–40)
Albumin: 4.7 g/dL (ref 3.8–4.8)
Alkaline Phosphatase: 44 IU/L (ref 39–117)
Bilirubin Total: 0.4 mg/dL (ref 0.0–1.2)
Bilirubin, Direct: 0.17 mg/dL (ref 0.00–0.40)
Total Protein: 6.5 g/dL (ref 6.0–8.5)

## 2019-06-20 LAB — LIPID PANEL
Chol/HDL Ratio: 3.6 ratio (ref 0.0–5.0)
Cholesterol, Total: 113 mg/dL (ref 100–199)
HDL: 31 mg/dL — ABNORMAL LOW (ref >39)
LDL Calculated: 22 mg/dL (ref 0–99)
Triglycerides: 302 mg/dL — ABNORMAL HIGH (ref 0–149)
VLDL Cholesterol Cal: 60 mg/dL — ABNORMAL HIGH (ref 5–40)

## 2019-06-29 ENCOUNTER — Telehealth: Payer: Self-pay | Admitting: *Deleted

## 2019-06-29 MED ORDER — ICOSAPENT ETHYL 1 G PO CAPS: 2.0000 g | ORAL_CAPSULE | Freq: Two times a day (BID) | ORAL | 5 refills | Status: DC

## 2019-06-29 NOTE — Telephone Encounter (Signed)
Spoke with pt and went over results and recommendations.  Advised pt I would send prescription in and to let me know if he is unable to afford the medication and I would send over to our PA dept.  Pt verbalized understanding and was in agreement with this plan.

## 2019-06-29 NOTE — Telephone Encounter (Signed)
-----   Message from Belva Crome, MD sent at 06/20/2019  7:24 PM EDT ----- Let the patient know the cholesterol is okay. Consider VASCEPA 2 grams BID as TG is high and increases risk. Need to consider if can afford .  A copy will be sent to Lawerance Cruel, MD

## 2019-07-02 ENCOUNTER — Telehealth: Payer: Self-pay

## 2019-07-02 NOTE — Telephone Encounter (Signed)
Completed P/A for Vascepa on Cover My Meds...KEY: [id number]  Approved: 06/02/2019 thru 07/01/2022  Case ID: [id number]  I called pharmacy and notified them of the approval. Kristopher Oppenheim, [place]

## 2019-08-09 ENCOUNTER — Other Ambulatory Visit: Payer: Self-pay | Admitting: *Deleted

## 2019-08-09 DIAGNOSIS — E782 Mixed hyperlipidemia: Secondary | ICD-10-CM

## 2019-08-21 ENCOUNTER — Other Ambulatory Visit: Payer: Commercial Managed Care - PPO

## 2019-08-22 ENCOUNTER — Other Ambulatory Visit: Payer: Self-pay

## 2019-08-22 ENCOUNTER — Other Ambulatory Visit: Payer: Commercial Managed Care - PPO | Admitting: *Deleted

## 2019-08-22 DIAGNOSIS — E782 Mixed hyperlipidemia: Secondary | ICD-10-CM

## 2019-08-22 DIAGNOSIS — I10 Essential (primary) hypertension: Secondary | ICD-10-CM

## 2019-08-22 DIAGNOSIS — I25119 Atherosclerotic heart disease of native coronary artery with unspecified angina pectoris: Secondary | ICD-10-CM

## 2019-08-22 LAB — LIPID PANEL
Chol/HDL Ratio: 4.3 ratio (ref 0.0–5.0)
Cholesterol, Total: 111 mg/dL (ref 100–199)
HDL: 26 mg/dL — ABNORMAL LOW (ref >39)
LDL Chol Calc (NIH): 38 mg/dL (ref 0–99)
Triglycerides: 309 mg/dL — ABNORMAL HIGH (ref 0–149)
VLDL Cholesterol Cal: 47 mg/dL — ABNORMAL HIGH (ref 5–40)

## 2019-08-22 LAB — BASIC METABOLIC PANEL
BUN/Creatinine Ratio: 20 (ref 10–24)
BUN: 20 mg/dL (ref 8–27)
CO2: 22 mmol/L (ref 20–29)
Calcium: 8.9 mg/dL (ref 8.6–10.2)
Chloride: 103 mmol/L (ref 96–106)
Creatinine, Ser: 0.99 mg/dL (ref 0.76–1.27)
GFR calc Af Amer: 93 mL/min/{1.73_m2} (ref >59)
GFR calc non Af Amer: 81 mL/min/{1.73_m2} (ref >59)
Glucose: 175 mg/dL — ABNORMAL HIGH (ref 65–99)
Potassium: 4.1 mmol/L (ref 3.5–5.2)
Sodium: 139 mmol/L (ref 134–144)

## 2019-08-22 LAB — HEPATIC FUNCTION PANEL
ALT: 31 IU/L (ref 0–44)
AST: 23 IU/L (ref 0–40)
Albumin: 4.5 g/dL (ref 3.8–4.8)
Alkaline Phosphatase: 45 IU/L (ref 39–117)
Bilirubin Total: 0.4 mg/dL (ref 0.0–1.2)
Bilirubin, Direct: 0.15 mg/dL (ref 0.00–0.40)
Total Protein: 6.4 g/dL (ref 6.0–8.5)

## 2019-08-22 LAB — CBC
Hematocrit: 43.6 % (ref 37.5–51.0)
Hemoglobin: 14.7 g/dL (ref 13.0–17.7)
MCH: 30.3 pg (ref 26.6–33.0)
MCHC: 33.7 g/dL (ref 31.5–35.7)
MCV: 90 fL (ref 79–97)
Platelets: 222 10*3/uL (ref 150–450)
RBC: 4.85 x10E6/uL (ref 4.14–5.80)
RDW: 12.7 % (ref 11.6–15.4)
WBC: 5.4 10*3/uL (ref 3.4–10.8)

## 2019-09-11 ENCOUNTER — Other Ambulatory Visit: Payer: Self-pay | Admitting: Cardiology

## 2019-09-12 NOTE — Telephone Encounter (Signed)
Rx(s) sent to pharmacy electronically.  

## 2019-11-07 ENCOUNTER — Other Ambulatory Visit: Payer: Self-pay | Admitting: Cardiology

## 2019-12-07 ENCOUNTER — Other Ambulatory Visit: Payer: Self-pay | Admitting: Interventional Cardiology

## 2019-12-23 ENCOUNTER — Other Ambulatory Visit: Payer: Self-pay | Admitting: Interventional Cardiology

## 2020-04-01 ENCOUNTER — Other Ambulatory Visit (HOSPITAL_COMMUNITY): Payer: Self-pay | Admitting: Family Medicine

## 2020-04-01 ENCOUNTER — Other Ambulatory Visit: Payer: Self-pay | Admitting: Family Medicine

## 2020-04-01 DIAGNOSIS — G459 Transient cerebral ischemic attack, unspecified: Secondary | ICD-10-CM

## 2020-04-02 ENCOUNTER — Encounter (HOSPITAL_COMMUNITY): Payer: Self-pay

## 2020-04-02 ENCOUNTER — Ambulatory Visit (HOSPITAL_COMMUNITY)
Admission: RE | Admit: 2020-04-02 | Discharge: 2020-04-02 | Disposition: A | Discharge status: Home / Self Care | Payer: Commercial Managed Care - PPO | Source: Ambulatory Visit | Attending: Family Medicine | Admitting: Family Medicine

## 2020-04-02 ENCOUNTER — Other Ambulatory Visit: Payer: Self-pay

## 2020-04-02 DIAGNOSIS — G459 Transient cerebral ischemic attack, unspecified: Secondary | ICD-10-CM

## 2020-04-04 ENCOUNTER — Other Ambulatory Visit: Payer: Self-pay

## 2020-04-04 ENCOUNTER — Ambulatory Visit (HOSPITAL_COMMUNITY)
Admission: RE | Admit: 2020-04-04 | Discharge: 2020-04-04 | Disposition: A | Discharge status: Home / Self Care | Payer: Commercial Managed Care - PPO | Source: Ambulatory Visit | Attending: Family Medicine | Admitting: Family Medicine

## 2020-04-04 DIAGNOSIS — G459 Transient cerebral ischemic attack, unspecified: Secondary | ICD-10-CM | POA: Diagnosis present

## 2020-04-07 ENCOUNTER — Ambulatory Visit (HOSPITAL_COMMUNITY)
Admission: RE | Admit: 2020-04-07 | Discharge: 2020-04-07 | Disposition: A | Discharge status: Home / Self Care | Payer: Commercial Managed Care - PPO | Source: Ambulatory Visit | Attending: Family Medicine | Admitting: Family Medicine

## 2020-04-07 ENCOUNTER — Ambulatory Visit: Payer: Commercial Managed Care - PPO | Admitting: Diagnostic Neuroimaging

## 2020-04-07 ENCOUNTER — Other Ambulatory Visit: Payer: Self-pay

## 2020-04-07 ENCOUNTER — Encounter: Payer: Self-pay | Admitting: Diagnostic Neuroimaging

## 2020-04-07 VITALS — BP 119/69 | HR 77 | Temp 97.7°F | Ht 65.0 in | Wt 197.0 lb

## 2020-04-07 DIAGNOSIS — E785 Hyperlipidemia, unspecified: Secondary | ICD-10-CM | POA: Diagnosis not present

## 2020-04-07 DIAGNOSIS — G459 Transient cerebral ischemic attack, unspecified: Secondary | ICD-10-CM

## 2020-04-07 DIAGNOSIS — I10 Essential (primary) hypertension: Secondary | ICD-10-CM | POA: Diagnosis not present

## 2020-04-07 DIAGNOSIS — G4733 Obstructive sleep apnea (adult) (pediatric): Secondary | ICD-10-CM | POA: Insufficient documentation

## 2020-04-07 DIAGNOSIS — R410 Disorientation, unspecified: Secondary | ICD-10-CM

## 2020-04-07 DIAGNOSIS — E119 Type 2 diabetes mellitus without complications: Secondary | ICD-10-CM | POA: Diagnosis not present

## 2020-04-07 DIAGNOSIS — I2510 Atherosclerotic heart disease of native coronary artery without angina pectoris: Secondary | ICD-10-CM | POA: Insufficient documentation

## 2020-04-07 DIAGNOSIS — H539 Unspecified visual disturbance: Secondary | ICD-10-CM

## 2020-04-07 NOTE — Progress Notes (Signed)
[facility]  PATIENT: Brian Rollins DOB: [birth year]  REFERRING CLINICIAN: Lawerance Cruel, MD HISTORY FROM: patient  REASON FOR VISIT: new consult     HISTORICAL  CHIEF COMPLAINT:  Chief Complaint  Patient presents with  . Transient loss of sensorium    rm 6 New Pt, "in last 1 1/5 yrs 3 episodes with some vision loss, episodes of not understanding conversation, lasted about 1 hour"    HISTORY OF PRESENT ILLNESS:   64 year old male here for evaluation of transient vision and confusion spell.  03/30/2020 patient was at church when all of a sudden he could not see several patches of vision in front of them.  These were different sizes and shapes.  There was some blurred vision with this.  He went to lunch with family afterwards and could not understand what they were saying.  He had a brief memory lapse.  Total symptoms lasted for about 1 hour.  He had a headache later in the day.  Patient has had 2 other similar episodes like this over the past 3 years.  He went to the emergency room in 2018 for 1 of these events and had MRI of the brain which was unremarkable.  Patient has had another MRI recently after this event which was also unremarkable.  Carotid ultrasound unremarkable.  History of coronary disease, hypertension, diabetes, hypercholesterolemia on medical treatment.  Patient has been under more stress over the past 1 to 2 years related to several factors.    REVIEW OF SYSTEMS: Full 14 system review of systems performed and negative with exception of: As per HPI.  ALLERGIES: Allergies  Allergen Reactions  . Mercury Other (See Comments)    Unknown reaction    HOME MEDICATIONS: Outpatient Medications Prior to Visit  Medication Sig Dispense Refill  . aspirin EC 81 MG tablet Take 81 mg by mouth daily.    . benazepril (LOTENSIN) 20 MG tablet Take 20 mg by mouth daily at 6 PM. Takes to protect kidneys    . clopidogrel (PLAVIX) 75 MG tablet TAKE ONE  TABLET BY MOUTH DAILY 30 tablet 6  . dapagliflozin propanediol (FARXIGA) 10 MG TABS tablet Take 10 mg by mouth daily.    Marland Kitchen glimepiride (AMARYL) 2 MG tablet Take 2 mg by mouth daily.    . metoprolol succinate (TOPROL-XL) 100 MG 24 hr tablet TAKE ONE TABLET BY MOUTH DAILY WITH OR IMMEDIATELY FOLLOWING A MEAL 90 tablet 3  . nitroGLYCERIN (NITROSTAT) 0.4 MG SL tablet Place 1 tablet (0.4 mg total) under the tongue every 5 (five) minutes as needed for chest pain. 25 tablet 1  . pantoprazole (PROTONIX) 40 MG tablet Take 40 mg by mouth daily.    . simvastatin (ZOCOR) 40 MG tablet Take 40 mg by mouth daily at 6 PM.     . SitaGLIPtin-MetFORMIN HCl (JANUMET XR) 50-1000 MG TB24 Take 2 tablets by mouth daily.     . tamsulosin (FLOMAX) 0.4 MG CAPS capsule Take 0.8 mg by mouth daily at 6 PM.     . VASCEPA 1 g capsule TAKE TWO CAPSULES BY MOUTH TWICE A DAY 120 capsule 5   No facility-administered medications prior to visit.    PAST MEDICAL HISTORY: Past Medical History:  Diagnosis Date  . Chest discomfort   . Chest pain on exertion   . GERD (gastroesophageal reflux disease)   . High cholesterol   . Hydrocele, left   . Hypertension   . Keratoconus   . OSA (obstructive  sleep apnea)    "mild; don't wear mask" (06/27/2018)  . Pneumonia 1980s X 1  . Rapid heart beat   . Type II diabetes mellitus ([place])     PAST SURGICAL HISTORY: Past Surgical History:  Procedure Laterality Date  . CERVICAL DISC ARTHROPLASTY N/A 01/01/2019   Procedure: Cervical Four-Five Artificial disc replacement;  Surgeon: Kristeen Miss, MD;  Location: [facility];  Service: Neurosurgery;  Laterality: N/A;  anterior  . CORONARY ANGIOPLASTY WITH STENT PLACEMENT  06/27/2018  . CORONARY STENT INTERVENTION N/A 06/27/2018   Procedure: CORONARY STENT INTERVENTION;  Surgeon: Belva Crome, MD;  Location: [place] [facility];  Service: Cardiovascular;  Laterality: N/A;  . HYDROCELE EXCISION  12/03/2011   Procedure: HYDROCELECTOMY ADULT;  Surgeon:  Claybon Jabs, MD;  Location[facility];  Service: Urology;  Laterality: Left;  . KNEE ARTHROSCOPY Bilateral 2000s X 2   "removed scar tissue on the right; repaired torn meniscus"  . KNEE ARTHROSCOPY W/ ACL RECONSTRUCTION Right 1990s  . RIGHT/LEFT HEART CATH AND CORONARY ANGIOGRAPHY N/A 06/27/2018   Procedure: RIGHT/LEFT HEART CATH AND CORONARY ANGIOGRAPHY;  Surgeon: Belva Crome, MD;  Location: [place] [facility];  Service: Cardiovascular;  Laterality: N/A;    FAMILY HISTORY: Family History  Adopted: Yes  Family history unknown: Yes    SOCIAL HISTORY: Social History   Socioeconomic History  . Marital status: Married    Spouse name: donna  . Number of children: 2  . Years of education: college  . Highest education level: Not on file  Occupational History  . 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 Smoking status: Former Smoker    Packs/day: 0.50    Years: 3.00    Pack years: 1.50    Types: Cigarettes  . Smokeless tobacco: Former Systems developer  . Tobacco comment: 06/27/2018 "quit smoking & using chew  in the early 1980s"  Substance and Sexual Activity  . Alcohol use: Yes    Alcohol/week: 0.0 standard drinks    Comment: 06/27/2018 "might drink once/month"  . Drug use: Never  . Sexual activity: Not Currently  Other Topics Concern  . Not on file  Social History Narrative   Lives with wife   Social Determinants of Health   Financial Resource Strain:   . Difficulty of Paying Living Expenses:   Food Insecurity:   . Worried About Charity fundraiser in the Last Year:   . Arboriculturist in the Last Year:   Transportation Needs:   . Film/video editor (Medical):   Marland Kitchen Lack of Transportation (Non-Medical):   Physical Activity:   . Days of Exercise per Week:   . Minutes of Exercise per Session:   Stress:   . Feeling of Stress :   Social Connections:   . Frequency of Communication with Friends and Family:   . Frequency of Social Gatherings with Friends  and Family:   . Attends Religious Services:   . Active Member of Clubs or Organizations:   . Attends Archivist Meetings:   Marland Kitchen Marital Status:   Intimate Partner Violence:   . Fear of Current or Ex-Partner:   . Emotionally Abused:   Marland Kitchen Physically Abused:   . Sexually Abused:      PHYSICAL EXAM  GENERAL EXAM/CONSTITUTIONAL: Vitals:  Vitals:   04/07/20 1402  BP: 119/69  Pulse: 77  Temp: 97.7 F (36.5 C)  Weight: 197 lb (89.4 kg)  Height: 5\' 5"  (1.651 m)  Body mass index is 32.78 kg/m. Wt Readings from Last 3 Encounters:  04/07/20 197 lb (89.4 kg)  05/22/19 200 lb (90.7 kg)  01/01/19 187 lb (84.8 kg)     Patient is in no distress; well developed, nourished and groomed; neck is supple  CARDIOVASCULAR:  Examination of carotid arteries is normal; no carotid bruits  Regular rate and rhythm, no murmurs  Examination of peripheral vascular system by observation and palpation is normal  EYES:  Ophthalmoscopic exam of optic discs and posterior segments is normal; no papilledema or hemorrhages  No exam data present  MUSCULOSKELETAL:  Gait, strength, tone, movements noted in Neurologic exam below  NEUROLOGIC: MENTAL STATUS:  No flowsheet data found.  awake, alert, oriented to person, place and time  recent and remote memory intact  normal attention and concentration  language fluent, comprehension intact, naming intact  fund of knowledge appropriate  CRANIAL NERVE:   2nd - no papilledema on fundoscopic exam  2nd, 3rd, 4th, 6th - pupils equal and reactive to light, visual fields full to confrontation, extraocular muscles intact, no nystagmus  5th - facial sensation symmetric  7th - facial strength symmetric  8th - hearing intact  9th - palate elevates symmetrically, uvula midline  11th - shoulder shrug symmetric  12th - tongue protrusion midline  MOTOR:   normal bulk and tone, full strength in the BUE, BLE  SENSORY:   normal  and symmetric to light touch, temperature, vibration  COORDINATION:   finger-nose-finger, fine finger movements normal  REFLEXES:   deep tendon reflexes present and symmetric  GAIT/STATION:   narrow based gait     DIAGNOSTIC DATA (LABS, IMAGING, TESTING) - I reviewed patient records, labs, notes, testing and imaging myself where available.  Lab Results  Component Value Date   WBC 5.4 08/22/2019   HGB 14.7 08/22/2019   HCT 43.6 08/22/2019   MCV 90 08/22/2019   PLT 222 08/22/2019      Component Value Date/Time   NA 139 08/22/2019 0742   K 4.1 08/22/2019 0742   CL 103 08/22/2019 0742   CO2 22 08/22/2019 0742   GLUCOSE 175 (H) 08/22/2019 0742   GLUCOSE 175 (H) 12/25/2018 1422   BUN 20 08/22/2019 0742   CREATININE 0.99 08/22/2019 0742   CALCIUM 8.9 08/22/2019 0742   PROT 6.4 08/22/2019 0742   ALBUMIN 4.5 08/22/2019 0742   AST 23 08/22/2019 0742   ALT 31 08/22/2019 0742   ALKPHOS 45 08/22/2019 0742   BILITOT 0.4 08/22/2019 0742   GFRNONAA 81 08/22/2019 0742   GFRAA 93 08/22/2019 0742   Lab Results  Component Value Date   CHOL 111 08/22/2019   HDL 26 (L) 08/22/2019   LDLCALC 38 08/22/2019   TRIG 309 (H) 08/22/2019   CHOLHDL 4.3 08/22/2019   Lab Results  Component Value Date   HGBA1C 8.9 (H) 12/25/2018   No results found for: VITAMINB12 Lab Results  Component Value Date   TSH 3.33 07/30/2015    04/18/17 MRI brain  - No acute intracranial process. - Mild white matter changes compatible with chronic small vessel ischemic disease.  04/04/20 MRI brain 1. No acute or subacute insult. 2. Mild chronic small vessel ischemia in the cerebral white matter.    ASSESSMENT AND PLAN  65 y.o. year old male here with several episodes of transient visual disturbance, confusion, memory lapse headache since 2018, with recent events suspicious for complicated migraine.  Neurologic exam and testing have been unremarkable.  Recommend to continue  vascular risk factor  management with medical therapy for stroke prevention.  Will complete TIA work-up.  If patient starts to have more frequent headaches can consider migraine treatments.  Dx:  1. Transient vision disturbance   2. Transient confusion     PLAN:  TRANSIENT VISUAL DISTURBANCE, APHASIA,  MEMORY LAPSE, HEADACHE (3 events since 2018; h/o migraine in childhood) - suspect complicated migraine - check echocardiogram to complete TIA workup - continue aspirin, plavix, statin, BP control, DM control (per PCP and cardiology)  Orders Placed This Encounter  Procedures  . ECHOCARDIOGRAM COMPLETE BUBBLE STUDY   Return for pending if symptoms worsen or fail to improve.    Penni Bombard, MD [id number], 0000000 PM Certified in Neurology, Neurophysiology and Neuroimaging  [facility] [address], [place], [place] [address] [phone]

## 2020-04-07 NOTE — Patient Instructions (Signed)
TRANSIENT VISUAL DISTURBANCE, APHASIA,  MEMORY LAPSE, HEADACHE (3 events since 2018; h/o migraine in childhood) - suspect complicated migraine  - check echocardiogram to complete TIA workup - continue aspirin, plavix, statin, BP control, DM control (per PCP and cardiology)

## 2020-04-07 NOTE — Progress Notes (Signed)
VASCULAR LAB PRELIMINARY  PRELIMINARY  PRELIMINARY  PRELIMINARY  Carotid duplex completed.    Preliminary report:  See CV proc for preliminary results  Shanquita Ronning, RVT 04/07/2020, 1:20 PM

## 2020-05-12 ENCOUNTER — Ambulatory Visit: Payer: Commercial Managed Care - PPO | Admitting: Neurology

## 2020-06-05 ENCOUNTER — Telehealth: Payer: Self-pay | Admitting: *Deleted

## 2020-06-05 ENCOUNTER — Other Ambulatory Visit: Payer: Self-pay

## 2020-06-05 ENCOUNTER — Ambulatory Visit (HOSPITAL_COMMUNITY): Payer: Commercial Managed Care - PPO | Attending: Cardiology

## 2020-06-05 DIAGNOSIS — G459 Transient cerebral ischemic attack, unspecified: Secondary | ICD-10-CM

## 2020-06-05 DIAGNOSIS — R410 Disorientation, unspecified: Secondary | ICD-10-CM

## 2020-06-05 DIAGNOSIS — H539 Unspecified visual disturbance: Secondary | ICD-10-CM

## 2020-06-05 MED ORDER — PERFLUTREN LIPID MICROSPHERE
1.0000 mL | INTRAVENOUS | Status: AC | PRN
  Administered 2020-06-05: 2 mL via INTRAVENOUS

## 2020-06-05 MED ORDER — SODIUM CHLORIDE 0.9% FLUSH
10.0000 mL | INTRAVENOUS | Status: AC | PRN
  Administered 2020-06-05: 10 mL via INTRAVENOUS

## 2020-06-05 NOTE — Telephone Encounter (Signed)
Spoke with patient and informed him the echocardiogram looks okay, please continue to FU with PCP, cardiology and take medications as prescribed. Patient verbalized understanding, appreciation.

## 2020-06-16 ENCOUNTER — Other Ambulatory Visit: Payer: Self-pay | Admitting: Interventional Cardiology

## 2020-07-06 NOTE — Progress Notes (Signed)
Cardiology Office Note:    Date:  07/07/2020   ID:  Brian Rollins, DOB [birth year], MRN [id number]  PCP:  Lawerance Cruel, MD  Cardiologist:  Sinclair Grooms, MD   Referring MD: Lawerance Cruel, MD   Chief Complaint  Patient presents with  . Coronary Artery Disease    History of Present Illness:    Brian Rollins is a 64 y.o. male with a hx of coronary artery disease, coronary PCI/stent to mid LAD July 2019, hyperlipidemia, hypertension, obstructive sleep apnea, type 2 diabetes mellitus, and recent TIA 03/2020.  He is now 2 years since complex LAD bifurcation stenting in July 2019.  There have been no recurring symptoms to suggest the presence of angina.  No bleeding complications on dual antiplatelet therapy.  Relatively sedentary lifestyle but is up walking about and doing physical activity multiple times each day on his job.  He has not needed nitroglycerin.  Says he sleeps well.  He is compliant with his medications as prescribed.  Past Medical History:  Diagnosis Date  . Chest discomfort   . Chest pain on exertion   . GERD (gastroesophageal reflux disease)   . High cholesterol   . Hydrocele, left   . Hypertension   . Keratoconus   . OSA (obstructive sleep apnea)    "mild; don't wear mask" (06/27/2018)  . Pneumonia 1980s X 1  . Rapid heart beat   . Type II diabetes mellitus ([place])     Past Surgical History:  Procedure Laterality Date  . CERVICAL DISC ARTHROPLASTY N/A 01/01/2019   Procedure: Cervical Four-Five Artificial disc replacement;  Surgeon: Kristeen Miss, MD;  Location: [facility];  Service: Neurosurgery;  Laterality: N/A;  anterior  . CORONARY ANGIOPLASTY WITH STENT PLACEMENT  06/27/2018  . CORONARY STENT INTERVENTION N/A 06/27/2018   Procedure: CORONARY STENT INTERVENTION;  Surgeon: Belva Crome, MD;  Location: [facility];  Service: Cardiovascular;  Laterality: N/A;  . HYDROCELE EXCISION  12/03/2011   Procedure: HYDROCELECTOMY ADULT;  Surgeon: Claybon Jabs, MD;  Location: [facility];  Service: Urology;  Laterality: Left;  . KNEE ARTHROSCOPY Bilateral 2000s X 2   "removed scar tissue on the right; repaired torn meniscus"  . KNEE ARTHROSCOPY W/ ACL RECONSTRUCTION Right 1990s  . RIGHT/LEFT HEART CATH AND CORONARY ANGIOGRAPHY N/A 06/27/2018   Procedure: RIGHT/LEFT HEART CATH AND CORONARY ANGIOGRAPHY;  Surgeon: Belva Crome, MD;  Location: [facility];  Service: Cardiovascular;  Laterality: N/A;    Current Medications: Current Meds  Medication Sig  . benazepril (LOTENSIN) 20 MG tablet Take 20 mg by mouth daily at 6 PM. Takes to protect kidneys  . clopidogrel (PLAVIX) 75 MG tablet TAKE ONE TABLET BY MOUTH DAILY  . dapagliflozin propanediol (FARXIGA) 10 MG TABS tablet Take 10 mg by mouth daily.  Marland Kitchen glimepiride (AMARYL) 2 MG tablet Take 2 mg by mouth daily.  . metoprolol succinate (TOPROL-XL) 100 MG 24 hr tablet TAKE ONE TABLET BY MOUTH DAILY WITH OR IMMEDIATELY FOLLOWING A MEAL  . nitroGLYCERIN (NITROSTAT) 0.4 MG SL tablet Place 1 tablet (0.4 mg total) under the tongue every 5 (five) minutes as needed for chest pain.  . pantoprazole (PROTONIX) 40 MG tablet Take 40 mg by mouth daily.  . simvastatin (ZOCOR) 40 MG tablet Take 40 mg by mouth daily at 6 PM.   . SitaGLIPtin-MetFORMIN HCl (JANUMET XR) 50-1000 MG TB24 Take 2 tablets by mouth daily.   . tamsulosin (FLOMAX) 0.4 MG  CAPS capsule Take 0.8 mg by mouth daily at 6 PM.   . VASCEPA 1 g capsule TAKE TWO CAPSULES BY MOUTH TWICE A DAY  . [DISCONTINUED] aspirin EC 81 MG tablet Take 81 mg by mouth daily.     Allergies:   Mercury   Social History   Socioeconomic History  . Marital status: Married    Spouse name: Brian Rollins  . Number of children: 2  . Years of education: college  . Highest education level: Not on file  Occupational History  . 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 Smoking status: Former Smoker    Packs/day: 0.50    Years: 3.00    Pack years:  1.50    Types: Cigarettes  . Smokeless tobacco: Former Systems developer  . Tobacco comment: 06/27/2018 "quit smoking & using chew  in the early 1980s"  Vaping Use  . Vaping Use: Never used  Substance and Sexual Activity  . Alcohol use: Yes    Alcohol/week: 0.0 standard drinks    Comment: 06/27/2018 "might drink once/month"  . Drug use: Never  . Sexual activity: Not Currently  Other Topics Concern  . Not on file  Social History Narrative   Lives with wife   Social Determinants of Health   Financial Resource Strain:   . Difficulty of Paying Living Expenses:   Food Insecurity:   . Worried About Charity fundraiser in the Last Year:   . Arboriculturist in the Last Year:   Transportation Needs:   . Film/video editor (Medical):   Marland Kitchen Lack of Transportation (Non-Medical):   Physical Activity:   . Days of Exercise per Week:   . Minutes of Exercise per Session:   Stress:   . Feeling of Stress :   Social Connections:   . Frequency of Communication with Friends and Family:   . Frequency of Social Gatherings with Friends and Family:   . Attends Religious Services:   . Active Member of Clubs or Organizations:   . Attends Archivist Meetings:   Marland Kitchen Marital Status:      Family History: The patient's He was adopted. Family history is unknown by patient.  ROS:   Please see the history of present illness.    Does not tolerate warm humid conditions.  Therefore he does not do much walking outdoors or yard work.  All other systems reviewed and are negative.  EKGs/Labs/Other Studies Reviewed:    The following studies were reviewed today:  CAROTID Ultrasound 03/2020: Summary:  Right Carotid: The extracranial vessels were near-normal with only minimal  wall         thickening or plaque.   Left Carotid: The extracranial vessels were near-normal with only minimal  wall        thickening or plaque.   Vertebrals: Bilateral vertebral arteries demonstrate antegrade flow.    Subclavians: Normal flow hemodynamics were seen in bilateral subclavian        arteries.   *See table(s) above for measurements and observations.      Electronically signed by Antony Contras MD on 04/07/2020 at 2:01:50 PM.   2D Doppler Echocardiogram 03/2020: IMPRESSIONS    1. Definity used; normal LV systolic function; grade 1 diastolic  dysfunction; mild LVH: negative saline microcavitation study.  2. Left ventricular ejection fraction, by estimation, is 60 to 65%. The  left ventricle has normal function. The left ventricle has no regional  wall motion abnormalities. There is mild left ventricular  hypertrophy.  Left ventricular diastolic parameters  are consistent with Grade I diastolic dysfunction (impaired relaxation).  Elevated left atrial pressure.  3. Right ventricular systolic function is normal. The right ventricular  size is normal.  4. The mitral valve is normal in structure. No evidence of mitral valve  regurgitation. No evidence of mitral stenosis.  5. The aortic valve is tricuspid. Aortic valve regurgitation is not  visualized. No aortic stenosis is present.   POST PCI 2019: Diagnostic Dominance: Right  Intervention       EKG:  EKG normal sinus rhythm, right bundle branch block, leftward axis but not reaching criteria for left anterior hemiblock.  There is no change when compared to January 2020.  Recent Labs: 08/22/2019: ALT 31; BUN 20; Creatinine, Ser 0.99; Hemoglobin 14.7; Platelets 222; Potassium 4.1; Sodium 139  Recent Lipid Panel    Component Value Date/Time   CHOL 111 08/22/2019 0742   TRIG 309 (H) 08/22/2019 0742   HDL 26 (L) 08/22/2019 0742   CHOLHDL 4.3 08/22/2019 0742   LDLCALC 38 08/22/2019 0742    Physical Exam:    VS:  BP 124/68   Pulse 83   Ht 5\' 5"  (1.651 m)   Wt 196 lb 6.4 oz (89.1 kg)   SpO2 94%   BMI 32.68 kg/m     Wt Readings from Last 3 Encounters:  07/07/20 196 lb 6.4 oz (89.1 kg)  04/07/20 197 lb (89.4 kg)   05/22/19 200 lb (90.7 kg)     GEN: Mildly obese.. No acute distress HEENT: Normal NECK: No JVD. LYMPHATICS: No lymphadenopathy CARDIAC:  RRR without murmur, gallop, or edema. VASCULAR:  Normal Pulses. No bruits. RESPIRATORY:  Clear to auscultation without rales, wheezing or rhonchi  ABDOMEN: Soft, non-tender, non-distended, No pulsatile mass, MUSCULOSKELETAL: No deformity  SKIN: Warm and dry NEUROLOGIC:  Alert and oriented x 3 PSYCHIATRIC:  Normal affect   ASSESSMENT:    1. Coronary artery disease involving native coronary artery of native heart with angina pectoris ([place])   2. Mixed hyperlipidemia   3. Essential hypertension   4. Type 2 diabetes mellitus with hyperosmolarity without coma, without long-term current use of insulin ([place])   5. TIA (transient ischemic attack)   6. Educated about COVID-19 virus infection    PLAN:    In order of problems listed above:  1. Discontinue aspirin.  Continue Plavix.  Secondary prevention as noted below was discussed in general terms.  The importance of treating sleep apnea, physical activity, and 6 hours of sleep are reiterated. 2. Continue Zocor 40 mg/day to keep LDL cholesterol less than 70. 3. Essential hypertension well-controlled on benazepril 20 mg/day and Toprol-XL 100 mg/day.. 4. Continue [place] which also provides cardioprotection.  Target hemoglobin A1c less than 7. 5. No recurrent neurological complaints. 6. The patient does not have record of vaccination.  We discussed the importance of vaccination.  Also discussed importance of social distancing  Overall education and awareness concerning primary/secondary risk prevention was discussed in detail: LDL less than 70, hemoglobin A1c less than 7, blood pressure target less than 130/80 mmHg, >150 minutes of moderate aerobic activity per week, avoidance of smoking, weight control (via diet and exercise), and continued surveillance/management of/for obstructive sleep  apnea.    Medication Adjustments/Labs and Tests Ordered: Current medicines are reviewed at length with the patient today.  Concerns regarding medicines are outlined above.  Orders Placed This Encounter  Procedures  . EKG 12-Lead   No orders of the defined types were  placed in this encounter.   Patient Instructions  Medication Instructions:  1) DISCONTINUE Aspirin  *If you need a refill on your cardiac medications before your next appointment, please call your pharmacy*   Lab Work: None If you have labs (blood work) drawn today and your tests are completely normal, you will receive your results only by: Marland Kitchen MyChart Message (if you have MyChart) OR . A paper copy in the mail If you have any lab test that is abnormal or we need to change your treatment, we will call you to review the results.   Testing/Procedures: None   Follow-Up: At [facility], you and your health needs are our priority.  As part of our continuing mission to provide you with exceptional heart care, we have created designated Provider Care Teams.  These Care Teams include your primary Cardiologist (physician) and Advanced Practice Providers (APPs -  Physician Assistants and Nurse Practitioners) who all work together to provide you with the care you need, when you need it.  We recommend signing up for the patient portal called "MyChart".  Sign up information is provided on this After Visit Summary.  MyChart is used to connect with patients for Virtual Visits (Telemedicine).  Patients are able to view lab/test results, encounter notes, upcoming appointments, etc.  Non-urgent messages can be sent to your provider as well.   To learn more about what you can do with MyChart, go to [email or web address].    Your next appointment:   12 month(s)  The format for your next appointment:   In Person  Provider:   You may see Sinclair Grooms, MD or one of the following Advanced Practice Providers on your designated  Care Team:    Truitt Merle, NP  Cecilie Kicks, NP  Kathyrn Drown, NP    Other Instructions  Your provider recommends that you maintain 150 minutes per week of moderate aerobic activity.     Signed, Sinclair Grooms, MD  07/07/2020 5:22 PM    [facility]

## 2020-07-07 ENCOUNTER — Other Ambulatory Visit: Payer: Self-pay

## 2020-07-07 ENCOUNTER — Ambulatory Visit: Payer: Commercial Managed Care - PPO | Admitting: Interventional Cardiology

## 2020-07-07 ENCOUNTER — Encounter: Payer: Self-pay | Admitting: Interventional Cardiology

## 2020-07-07 VITALS — BP 124/68 | HR 83 | Ht 65.0 in | Wt 196.4 lb

## 2020-07-07 DIAGNOSIS — G459 Transient cerebral ischemic attack, unspecified: Secondary | ICD-10-CM

## 2020-07-07 DIAGNOSIS — E1100 Type 2 diabetes mellitus with hyperosmolarity without nonketotic hyperglycemic-hyperosmolar coma (NKHHC): Secondary | ICD-10-CM | POA: Diagnosis not present

## 2020-07-07 DIAGNOSIS — Z7189 Other specified counseling: Secondary | ICD-10-CM

## 2020-07-07 DIAGNOSIS — E782 Mixed hyperlipidemia: Secondary | ICD-10-CM | POA: Diagnosis not present

## 2020-07-07 DIAGNOSIS — I10 Essential (primary) hypertension: Secondary | ICD-10-CM | POA: Diagnosis not present

## 2020-07-07 DIAGNOSIS — I25119 Atherosclerotic heart disease of native coronary artery with unspecified angina pectoris: Secondary | ICD-10-CM

## 2020-07-07 NOTE — Patient Instructions (Signed)
Medication Instructions:  1) DISCONTINUE Aspirin  *If you need a refill on your cardiac medications before your next appointment, please call your pharmacy*   Lab Work: None If you have labs (blood work) drawn today and your tests are completely normal, you will receive your results only by: Marland Kitchen MyChart Message (if you have MyChart) OR . A paper copy in the mail If you have any lab test that is abnormal or we need to change your treatment, we will call you to review the results.   Testing/Procedures: None   Follow-Up: At [facility], you and your health needs are our priority.  As part of our continuing mission to provide you with exceptional heart care, we have created designated Provider Care Teams.  These Care Teams include your primary Cardiologist (physician) and Advanced Practice Providers (APPs -  Physician Assistants and Nurse Practitioners) who all work together to provide you with the care you need, when you need it.  We recommend signing up for the patient portal called "MyChart".  Sign up information is provided on this After Visit Summary.  MyChart is used to connect with patients for Virtual Visits (Telemedicine).  Patients are able to view lab/test results, encounter notes, upcoming appointments, etc.  Non-urgent messages can be sent to your provider as well.   To learn more about what you can do with MyChart, go to [email or web address].    Your next appointment:   12 month(s)  The format for your next appointment:   In Person  Provider:   You may see Sinclair Grooms, MD or one of the following Advanced Practice Providers on your designated Care Team:    Truitt Merle, NP  Cecilie Kicks, NP  Kathyrn Drown, NP    Other Instructions  Your provider recommends that you maintain 150 minutes per week of moderate aerobic activity.

## 2020-11-12 ENCOUNTER — Other Ambulatory Visit: Payer: Self-pay

## 2020-11-12 MED ORDER — NITROGLYCERIN 0.4 MG SL SUBL: 0.4000 mg | SUBLINGUAL_TABLET | SUBLINGUAL | 1 refills | Status: DC | PRN

## 2021-03-01 ENCOUNTER — Other Ambulatory Visit: Payer: Self-pay | Admitting: Interventional Cardiology

## 2021-04-30 ENCOUNTER — Other Ambulatory Visit: Payer: Self-pay | Admitting: Interventional Cardiology

## 2021-08-28 ENCOUNTER — Other Ambulatory Visit: Payer: Self-pay

## 2021-08-28 MED ORDER — ICOSAPENT ETHYL 1 G PO CAPS: 2.0000 g | ORAL_CAPSULE | Freq: Two times a day (BID) | ORAL | 0 refills | Status: DC

## 2021-09-27 ENCOUNTER — Other Ambulatory Visit: Payer: Self-pay | Admitting: Interventional Cardiology

## 2021-10-25 ENCOUNTER — Other Ambulatory Visit: Payer: Self-pay | Admitting: Interventional Cardiology

## 2021-10-27 ENCOUNTER — Other Ambulatory Visit: Payer: Self-pay | Admitting: Interventional Cardiology

## 2021-11-23 ENCOUNTER — Other Ambulatory Visit: Payer: Self-pay | Admitting: Interventional Cardiology

## 2021-11-25 ENCOUNTER — Other Ambulatory Visit: Payer: Self-pay

## 2021-11-25 MED ORDER — ICOSAPENT ETHYL 1 G PO CAPS: 2.0000 g | ORAL_CAPSULE | Freq: Two times a day (BID) | ORAL | 0 refills | Status: DC

## 2021-11-25 NOTE — Telephone Encounter (Signed)
Pt's medication was sent to pt's pharmacy as requested. Confirmation received.  °

## 2022-02-14 ENCOUNTER — Other Ambulatory Visit: Payer: Self-pay | Admitting: Interventional Cardiology

## 2022-02-17 NOTE — Progress Notes (Signed)
?Cardiology Office Note:   ? ?Date:  02/19/2022  ? ?ID:  Brian Rollins, DOB 11-03-1956, MRN [id number] ? ?PCP:  Lawerance Cruel, MD  ?Cardiologist:  Sinclair Grooms, MD  ? ?Referring MD: Lawerance Cruel, MD  ? ?Chief Complaint  ?Patient presents with  ? Coronary Artery Disease  ? Hypertension  ? Hyperlipidemia  ? Follow-up  ?  Diabetes mellitus  ? ? ?History of Present Illness:   ? ?Brian Rollins is a 66 y.o. male with a hx of coronary artery disease, coronary PCI/stent to mid LAD July 2019, hyperlipidemia, hypertension, obstructive sleep apnea, and type 2 diabetes mellitus.  History of tachycardia. ? ? ?He is doing okay.  He has bilateral knee problems.  Right is status post surgery.  He has a meniscus in the left knee.  The knee issues are chronic. ? ?He is not having angina, dyspnea, palpitations, or any particular cardiac complaints.  No medication side effects.  Also reduce the number of medications he takes.  He is on 4 different agents for diabetes.  A1c this month was still greater than 8.  He has significant dyslipidemia and is requiring Zocor and Vascepa. ? ?Past Medical History:  ?Diagnosis Date  ? Chest discomfort   ? Chest pain on exertion   ? GERD (gastroesophageal reflux disease)   ? High cholesterol   ? Hydrocele, left   ? Hypertension   ? Keratoconus   ? OSA (obstructive sleep apnea)   ? "mild; don't wear mask" (06/27/2018)  ? Pneumonia 1980s X 1  ? Rapid heart beat   ? Type II diabetes mellitus ([place])   ? ? ?Past Surgical History:  ?Procedure Laterality Date  ? CERVICAL DISC ARTHROPLASTY N/A 01/01/2019  ? Procedure: Cervical Four-Five Artificial disc replacement;  Surgeon: Kristeen Miss, MD;  Location: [facility];  Service: Neurosurgery;  Laterality: N/A;  anterior  ? CORONARY ANGIOPLASTY WITH STENT PLACEMENT  06/27/2018  ? CORONARY STENT INTERVENTION N/A 06/27/2018  ? Procedure: CORONARY STENT INTERVENTION;  Surgeon: Belva Crome, MD;  Location: [facility];  Service: Cardiovascular;   Laterality: N/A;  ? HYDROCELE EXCISION  12/03/2011  ? Procedure: HYDROCELECTOMY ADULT;  Surgeon: Claybon Jabs, MD;  Location: [facility];  Service: Urology;  Laterality: Left;  ? KNEE ARTHROSCOPY Bilateral 2000s X 2  ? "removed scar tissue on the right; repaired torn meniscus"  ? KNEE ARTHROSCOPY W/ ACL RECONSTRUCTION Right 1990s  ? RIGHT/LEFT HEART CATH AND CORONARY ANGIOGRAPHY N/A 06/27/2018  ? Procedure: RIGHT/LEFT HEART CATH AND CORONARY ANGIOGRAPHY;  Surgeon: Belva Crome, MD;  Location: [facility];  Service: Cardiovascular;  Laterality: N/A;  ? ? ?Current Medications: ?Current Meds  ?Medication Sig  ? aspirin EC 81 MG tablet Take 1 tablet (81 mg total) by mouth daily. Swallow whole.  ? benazepril (LOTENSIN) 20 MG tablet Take 20 mg by mouth daily at 6 PM. Takes to protect kidneys  ? dapagliflozin propanediol (FARXIGA) 10 MG TABS tablet Take 10 mg by mouth daily.  ? glimepiride (AMARYL) 4 MG tablet Take 4 mg by mouth daily.  ? icosapent Ethyl (VASCEPA) 1 g capsule Take 2 capsules (2 g total) by mouth 2 (two) times daily. Please keep upcoming appt with Dr. Tamala Julian in March 2023 before anymore refills. Thank you Final Attempt  ? Lancets (ONETOUCH DELICA PLUS [id number]) MISC Apply 1 each topically as directed.  ? metoprolol succinate (TOPROL-XL) 100 MG 24 hr tablet TAKE ONE TABLET BY MOUTH  DAILY WITH OR IMMEDIATELY FOLLOWING A MEAL  ? nitroGLYCERIN (NITROSTAT) 0.4 MG SL tablet Place 1 tablet (0.4 mg total) under the tongue every 5 (five) minutes as needed for chest pain.  ? ONETOUCH ULTRA test strip as directed.  ? pantoprazole (PROTONIX) 40 MG tablet Take 40 mg by mouth daily.  ? pioglitazone (ACTOS) 45 MG tablet Take 45 mg by mouth daily.  ? simvastatin (ZOCOR) 40 MG tablet Take 40 mg by mouth daily at 6 PM.   ? SitaGLIPtin-MetFORMIN HCl 50-1000 MG TB24 Take 2 tablets by mouth daily.   ? tamsulosin (FLOMAX) 0.4 MG CAPS capsule Take 0.8 mg by mouth daily at 6 PM.   ? [DISCONTINUED]  clopidogrel (PLAVIX) 75 MG tablet TAKE ONE TABLET BY MOUTH DAILY  ?  ? ?Allergies:   Mercury  ? ?Social History  ? ?Socioeconomic History  ? Marital status: Married  ?  Spouse name: donna  ? Number of children: 2  ? Years of education: college  ? Highest education level: Not on file  ?Occupational History  ? Occupation: [employer]  ?Tobacco Use  ? Smoking status: Former  ?  Packs/day: 0.50  ?  Years: 3.00  ?  Pack years: 1.50  ?  Types: Cigarettes  ? Smokeless tobacco: Former  ? Tobacco comments:  ?  06/27/2018 "quit smoking & using chew  in the early 1980s"  ?Vaping Use  ? Vaping Use: Never used  ?Substance and Sexual Activity  ? Alcohol use: Yes  ?  Alcohol/week: 0.0 standard drinks  ?  Comment: 06/27/2018 "might drink once/month"  ? Drug use: Never  ? Sexual activity: Not Currently  ?Other Topics Concern  ? Not on file  ?Social History Narrative  ? Lives with wife  ? ?Social Determinants of Health  ? ?Financial Resource Strain: Not on file  ?Food Insecurity: Not on file  ?Transportation Needs: Not on file  ?Physical Activity: Not on file  ?Stress: Not on file  ?Social Connections: Not on file  ?  ? ?Family History: ?The patient's He was adopted. Family history is unknown by patient. ? ?ROS:   ?Please see the history of present illness.    ?Muscle aches and pains but otherwise doing okay.  Does not walk as much as he wants to because of left foot drop.  All other systems reviewed and are negative. ? ?EKGs/Labs/Other Studies Reviewed:   ? ?The following studies were reviewed today: ?CDiagnostic ?Dominance: Right ?Intervention ? ?ardiac cath with PCI 2019: ? ?Doppler echocardiogram July 2021 ?IMPRESSIONS  ? 1. Definity used; normal LV systolic function; grade 1 diastolic  ?dysfunction; mild LVH: negative saline microcavitation study.  ? 2. Left ventricular ejection fraction, by estimation, is 60 to 65%. The  ?left ventricle has normal function. The left ventricle has no regional  ?wall motion abnormalities. There  is mild left ventricular hypertrophy.  ?Left ventricular diastolic parameters  ?are consistent with Grade I diastolic dysfunction (impaired relaxation).  ?Elevated left atrial pressure.  ? 3. Right ventricular systolic function is normal. The right ventricular  ?size is normal.  ? 4. The mitral valve is normal in structure. No evidence of mitral valve  ?regurgitation. No evidence of mitral stenosis.  ? 5. The aortic valve is tricuspid. Aortic valve regurgitation is not  ?visualized. No aortic stenosis is present.  ? ?EKG:  EKG no data ? ?Recent Labs: ?No results found for requested labs within last 8760 hours.  ?Recent Lipid Panel ?   ?Component  Value Date/Time  ? CHOL 111 08/22/2019 0742  ? TRIG 309 (H) 08/22/2019 0742  ? HDL 26 (L) 08/22/2019 0742  ? CHOLHDL 4.3 08/22/2019 0742  ? [place] 38 08/22/2019 0742  ? ? ?Physical Exam:   ? ?VS:  BP 102/60   Pulse 84   Ht '5\' 5"'$  (1.651 m)   Wt 192 lb 3.2 oz (87.2 kg)   SpO2 96%   BMI 31.98 kg/m?    ? ?Wt Readings from Last 3 Encounters:  ?02/19/22 192 lb 3.2 oz (87.2 kg)  ?07/07/20 196 lb 6.4 oz (89.1 kg)  ?04/07/20 197 lb (89.4 kg)  ?  ? ?GEN: Slightly overweight. No acute distress ?HEENT: Normal ?NECK: No JVD. ?LYMPHATICS: No lymphadenopathy ?CARDIAC: No murmur. RRR S4 but no S3 gallop, or edema. ?VASCULAR:  Normal Pulses. No bruits. ?RESPIRATORY:  Clear to auscultation without rales, wheezing or rhonchi  ?ABDOMEN: Soft, non-tender, non-distended, No pulsatile mass, ?MUSCULOSKELETAL: No deformity  ?SKIN: Warm and dry ?NEUROLOGIC:  Alert and oriented x 3 ?PSYCHIATRIC:  Normal affect  ? ?ASSESSMENT:   ? ?1. Coronary artery disease involving native coronary artery of native heart with angina pectoris ([place])   ?2. Mixed hyperlipidemia   ?3. Essential hypertension   ?4. Type 2 diabetes mellitus with hyperosmolarity without coma, without long-term current use of insulin ([place])   ?5. TIA (transient ischemic attack)   ? ?PLAN:   ? ?In order of problems listed  above: ? ?Secondary prevention discussed.  We will change to coated 81 mg aspirin per day rather than clopidogrel. ?Continue statin and Vascepa for risk reduction.  Triglyceride is still elevated.  Hopefully Vascepa will notify our ris

## 2022-02-19 ENCOUNTER — Encounter: Payer: Self-pay | Admitting: Interventional Cardiology

## 2022-02-19 ENCOUNTER — Other Ambulatory Visit: Payer: Self-pay

## 2022-02-19 ENCOUNTER — Ambulatory Visit: Payer: Commercial Managed Care - PPO | Admitting: Interventional Cardiology

## 2022-02-19 VITALS — BP 102/60 | HR 84 | Ht 65.0 in | Wt 192.2 lb

## 2022-02-19 DIAGNOSIS — E782 Mixed hyperlipidemia: Secondary | ICD-10-CM | POA: Diagnosis not present

## 2022-02-19 DIAGNOSIS — I25119 Atherosclerotic heart disease of native coronary artery with unspecified angina pectoris: Secondary | ICD-10-CM | POA: Diagnosis not present

## 2022-02-19 DIAGNOSIS — E1100 Type 2 diabetes mellitus with hyperosmolarity without nonketotic hyperglycemic-hyperosmolar coma (NKHHC): Secondary | ICD-10-CM | POA: Diagnosis not present

## 2022-02-19 DIAGNOSIS — G459 Transient cerebral ischemic attack, unspecified: Secondary | ICD-10-CM

## 2022-02-19 DIAGNOSIS — I10 Essential (primary) hypertension: Secondary | ICD-10-CM | POA: Diagnosis not present

## 2022-02-19 MED ORDER — ASPIRIN EC 81 MG PO TBEC: 81.0000 mg | DELAYED_RELEASE_TABLET | Freq: Every day | ORAL | 3 refills | Status: AC

## 2022-02-19 NOTE — Patient Instructions (Signed)
Medication Instructions:  ?1) DISCONTINUE Plavix ?2) START Aspirin '81mg'$  once daily ? ?*If you need a refill on your cardiac medications before your next appointment, please call your pharmacy* ? ? ?Lab Work: ?None ?If you have labs (blood work) drawn today and your tests are completely normal, you will receive your results only by: ?MyChart Message (if you have MyChart) OR ?A paper copy in the mail ?If you have any lab test that is abnormal or we need to change your treatment, we will call you to review the results. ? ? ?Testing/Procedures: ?None ? ? ?Follow-Up: ?At [facility], you and your health needs are our priority.  As part of our continuing mission to provide you with exceptional heart care, we have created designated Provider Care Teams.  These Care Teams include your primary Cardiologist (physician) and Advanced Practice Providers (APPs -  Physician Assistants and Nurse Practitioners) who all work together to provide you with the care you need, when you need it. ? ?We recommend signing up for the patient portal called "MyChart".  Sign up information is provided on this After Visit Summary.  MyChart is used to connect with patients for Virtual Visits (Telemedicine).  Patients are able to view lab/test results, encounter notes, upcoming appointments, etc.  Non-urgent messages can be sent to your provider as well.   ?To learn more about what you can do with MyChart, go to [email or web address].   ? ?Your next appointment:   ?1 year(s) ? ?The format for your next appointment:   ?In Person ? ?Provider:   ?Sinclair Grooms, MD  ? ? ?Other Instructions ?  ?

## 2022-02-23 ENCOUNTER — Other Ambulatory Visit: Payer: Self-pay | Admitting: Interventional Cardiology

## 2022-06-02 ENCOUNTER — Telehealth: Payer: Self-pay

## 2022-06-02 NOTE — Telephone Encounter (Signed)
Prior authorization for Dalene Seltzer has been approved.  For patients covered by Medicare Part D, you may submit a tier exception form to the plan or PBM. If approved by the plan or PBM, it may reduce the patient's out-of-pocket costs. Type "tier exception" in the "plan or PBM" field on the Form Pick page to view eligible tier exception forms.  Message from plan: [id number]; Status:Approved;Review Type:Prior Auth;Coverage Start Date:05/03/2022;Coverage End Date:06/02/2023;

## 2022-06-29 ENCOUNTER — Telehealth: Payer: Self-pay | Admitting: *Deleted

## 2022-06-29 NOTE — Telephone Encounter (Signed)
Pt agreeable to plan of care for tele pre op appt 07/30/22 @ 9 am. Med rec and consent are done      Patient Consent for Virtual Visit        Brian Rollins has provided verbal consent on 06/29/2022 for a virtual visit (video or telephone).   CONSENT FOR VIRTUAL VISIT FOR:  Brian Rollins  By participating in this virtual visit I agree to the following:  I hereby voluntarily request, consent and authorize [facility] and its employed or contracted physicians, physician assistants, nurse practitioners or other licensed health care professionals (the Practitioner), to provide me with telemedicine health care services (the "Services") as deemed necessary by the treating Practitioner. I acknowledge and consent to receive the Services by the Practitioner via telemedicine. I understand that the telemedicine visit will involve communicating with the Practitioner through live audiovisual communication technology and the disclosure of certain medical information by electronic transmission. I acknowledge that I have been given the opportunity to request an in-person assessment or other available alternative prior to the telemedicine visit and am voluntarily participating in the telemedicine visit.  I understand that I have the right to withhold or withdraw my consent to the use of telemedicine in the course of my care at any time, without affecting my right to future care or treatment, and that the Practitioner or I may terminate the telemedicine visit at any time. I understand that I have the right to inspect all information obtained and/or recorded in the course of the telemedicine visit and may receive copies of available information for a reasonable fee.  I understand that some of the potential risks of receiving the Services via telemedicine include:  Delay or interruption in medical evaluation due to technological equipment failure or disruption; Information transmitted may not be sufficient (e.g. poor  resolution of images) to allow for appropriate medical decision making by the Practitioner; and/or  In rare instances, security protocols could fail, causing a breach of personal health information.  Furthermore, I acknowledge that it is my responsibility to provide information about my medical history, conditions and care that is complete and accurate to the best of my ability. I acknowledge that Practitioner's advice, recommendations, and/or decision may be based on factors not within their control, such as incomplete or inaccurate data provided by me or distortions of diagnostic images or specimens that may result from electronic transmissions. I understand that the practice of medicine is not an exact science and that Practitioner makes no warranties or guarantees regarding treatment outcomes. I acknowledge that a copy of this consent can be made available to me via my patient portal ([facility]), or I can request a printed copy by calling the office of [facility].    I understand that my insurance will be billed for this visit.   I have read or had this consent read to me. I understand the contents of this consent, which adequately explains the benefits and risks of the Services being provided via telemedicine.  I have been provided ample opportunity to ask questions regarding this consent and the Services and have had my questions answered to my satisfaction. I give my informed consent for the services to be provided through the use of telemedicine in my medical care

## 2022-06-29 NOTE — Telephone Encounter (Signed)
   Name: Brian Rollins  DOB: [birth year]  MRN: [id number]  Primary Cardiologist: Sinclair Grooms, MD   Preoperative team, please contact this patient and set up a phone call appointment for further preoperative risk assessment. Please obtain consent and complete medication review. Thank you for your help.  Patient can hold ASA if required for 7 days prior to procedure per DAPT management protocol.  I confirm that guidance regarding antiplatelet and oral anticoagulation therapy has been completed and, if necessary, noted below.    Mable Fill, Marissa Nestle, NP 06/29/2022, 12:33 PM [place] [address] [place], [place] [address]

## 2022-06-29 NOTE — Telephone Encounter (Signed)
Pt agreeable to plan of care for tele pre op appt 07/30/22 @ 9 am. Med rec and consent are done

## 2022-06-29 NOTE — Telephone Encounter (Signed)
   Pre-operative Risk Assessment    Patient Name: Brian Rollins  DOB: Jan 18, 1956 MRN: [id number]      Request for Surgical Clearance    Procedure:   COLONOSCOPY  Date of Surgery:  Clearance 08/26/22                                 Surgeon:  DR. MARC MAGOD Surgeon's Group or Practice Name:  [facility] Phone number:  [phone] Fax number:  [phone]   Type of Clearance Requested:   - Medical ; NO MEDICATIONS ARE LISTED AS NEEDING TO BE HELD    Type of Anesthesia:  Not Indicated (PROPOFOL?)   Additional requests/questions:    Brian Rollins   06/29/2022, 12:10 PM

## 2022-07-30 ENCOUNTER — Ambulatory Visit: Payer: Commercial Managed Care - PPO | Attending: Cardiology | Admitting: Physician Assistant

## 2022-07-30 DIAGNOSIS — Z01810 Encounter for preprocedural cardiovascular examination: Secondary | ICD-10-CM | POA: Diagnosis not present

## 2022-07-30 NOTE — Progress Notes (Signed)
Virtual Visit via Telephone Note   Because of Waldo Damian co-morbid illnesses, he is at least at moderate risk for complications without adequate follow up.  This format is felt to be most appropriate for this patient at this time.  The patient did not have access to video technology/had technical difficulties with video requiring transitioning to audio format only (telephone).  All issues noted in this document were discussed and addressed.  No physical exam could be performed with this format.  Please refer to the patient's chart for his consent to telehealth for [facility].  Evaluation Performed:  Preoperative cardiovascular risk assessment _____________   Date:  07/30/2022   Patient ID:  Brian Rollins, DOB [birth year], MRN [id number] Patient Location:  Home Provider location:   Office  Primary Care Provider:  Lawerance Cruel, MD Primary Cardiologist:  Sinclair Grooms, MD  Chief Complaint / Patient Profile   66 y.o. y/o male with a h/o CAD, hypertension, hyperlipidemia, DM2, and obstructive sleep apnea who is pending colonoscopy by Dr. Watt Climes and presents today for telephonic preoperative cardiovascular risk assessment.  Past Medical History    Past Medical History:  Diagnosis Date   Chest discomfort    Chest pain on exertion    GERD (gastroesophageal reflux disease)    High cholesterol    Hydrocele, left    Hypertension    Keratoconus    OSA (obstructive sleep apnea)    "mild; don't wear mask" (06/27/2018)   Pneumonia 1980s X 1   Rapid heart beat    Type II diabetes mellitus ([place])    Past Surgical History:  Procedure Laterality Date   CERVICAL DISC ARTHROPLASTY N/A 01/01/2019   Procedure: Cervical Four-Five Artificial disc replacement;  Surgeon: Kristeen Miss, MD;  Location: [facility];  Service: Neurosurgery;  Laterality: N/A;  anterior   CORONARY ANGIOPLASTY WITH STENT PLACEMENT  06/27/2018   CORONARY STENT INTERVENTION N/A 06/27/2018   Procedure:  CORONARY STENT INTERVENTION;  Surgeon: Belva Crome, MD;  Location: [facility];  Service: Cardiovascular;  Laterality: N/A;   HYDROCELE EXCISION  12/03/2011   Procedure: HYDROCELECTOMY ADULT;  Surgeon: Claybon Jabs, MD;  [facility]: [facility];  Service: Urology;  Laterality: Left;   KNEE ARTHROSCOPY Bilateral 2000s X 2   "removed scar tissue on the right; repaired torn meniscus"   KNEE ARTHROSCOPY W/ ACL RECONSTRUCTION Right 1990s   RIGHT/LEFT HEART CATH AND CORONARY ANGIOGRAPHY N/A 06/27/2018   Procedure: RIGHT/LEFT HEART CATH AND CORONARY ANGIOGRAPHY;  Surgeon: Belva Crome, MD;  Location: [facility];  Service: Cardiovascular;  Laterality: N/A;    Allergies  Allergies  Allergen Reactions   Mercury Other (See Comments)    Unknown reaction    History of Present Illness    Brian Rollins is a 66 y.o. male who presents via audio/video conferencing for a telehealth visit today.  Pt was last seen in cardiology clinic on 02/19/2022 by Dr. Tamala Julian.  At that time Brian Rollins was doing well.  The patient is now pending procedure as outlined above. Since his last visit, he has been doing well without exertional chest pain or worsening dyspnea   Home Medications    Prior to Admission medications   Medication Sig Start Date End Date Taking? Authorizing Provider  aspirin EC 81 MG tablet Take 1 tablet (81 mg total) by mouth daily. Swallow whole. 02/19/22   Belva Crome, MD  benazepril (LOTENSIN) 20 MG tablet Take 20 mg  by mouth daily at 6 PM. Takes to protect kidneys    [provider]  dapagliflozin propanediol (FARXIGA) 10 MG TABS tablet Take 10 mg by mouth daily.    [provider]  glimepiride (AMARYL) 4 MG tablet Take 4 mg by mouth daily.    [provider]  icosapent Ethyl (VASCEPA) 1 g capsule TAKE 2 CAPSULES BY MOUTH TWICE A DAY 02/23/22   Belva Crome, MD  Lancets (ONETOUCH DELICA PLUS [id number]) [place] Apply 1 each topically as  directed. 11/26/21   [provider]  metoprolol succinate (TOPROL-XL) 100 MG 24 hr tablet TAKE ONE TABLET BY MOUTH DAILY WITH OR IMMEDIATELY FOLLOWING A MEAL 05/23/19   Belva Crome, MD  nitroGLYCERIN (NITROSTAT) 0.4 MG SL tablet Place 1 tablet (0.4 mg total) under the tongue every 5 (five) minutes as needed for chest pain. 11/12/20   Belva Crome, MD  [facility] ULTRA test strip as directed. 11/25/21   [provider]  pantoprazole (PROTONIX) 40 MG tablet Take 40 mg by mouth daily. 03/29/17   [provider]  pioglitazone (ACTOS) 45 MG tablet Take 45 mg by mouth daily. 02/11/22   [provider]  simvastatin (ZOCOR) 40 MG tablet Take 40 mg by mouth daily at 6 PM.     [provider]  SitaGLIPtin-MetFORMIN HCl 50-1000 MG TB24 Take 2 tablets by mouth daily.     [provider]  tamsulosin (FLOMAX) 0.4 MG CAPS capsule Take 0.8 mg by mouth daily at 6 PM.     [provider]    Physical Exam    Vital Signs:  Brian Rollins does not have vital signs available for review today.  Given telephonic nature of communication, physical exam is limited. AAOx3. NAD. Normal affect.  Speech and respirations are unlabored.  Accessory Clinical Findings    None  Assessment & Plan    1.  Preoperative Cardiovascular Risk Assessment:  -Patient previously underwent stenting of mid LAD in July 2019, since then, he has been doing well without any further anginal symptom.  He denies any recent chest pain or worsening dyspnea.  We would like for him to continue on aspirin through the procedure if at all possible, however if bleeding risk is too high, he may hold aspirin for 5 to 7 days prior to the procedure and restart as soon as possible afterward.  He may also hold Vascepa for 7 days prior to the procedure as well.  A copy of this note will be routed to requesting surgeon.  Time:   Today, I have spent 5 minutes with the patient with telehealth  technology discussing medical history, symptoms, and management plan.     Almyra Deforest, [place]  07/30/2022, 9:16 AM

## 2023-01-11 ENCOUNTER — Other Ambulatory Visit: Payer: Self-pay

## 2023-01-11 MED ORDER — ICOSAPENT ETHYL 1 G PO CAPS: 2.0000 g | ORAL_CAPSULE | Freq: Two times a day (BID) | ORAL | 2 refills | Status: DC

## 2023-04-14 ENCOUNTER — Encounter: Payer: Self-pay | Admitting: Cardiology

## 2023-04-14 ENCOUNTER — Ambulatory Visit: Payer: Commercial Managed Care - PPO | Attending: Cardiology | Admitting: Cardiology

## 2023-04-14 VITALS — BP 130/70 | HR 88 | Ht 65.0 in | Wt 191.8 lb

## 2023-04-14 DIAGNOSIS — G459 Transient cerebral ischemic attack, unspecified: Secondary | ICD-10-CM

## 2023-04-14 DIAGNOSIS — I25119 Atherosclerotic heart disease of native coronary artery with unspecified angina pectoris: Secondary | ICD-10-CM

## 2023-04-14 DIAGNOSIS — E782 Mixed hyperlipidemia: Secondary | ICD-10-CM

## 2023-04-14 DIAGNOSIS — I10 Essential (primary) hypertension: Secondary | ICD-10-CM

## 2023-04-14 NOTE — Patient Instructions (Signed)
Medication Instructions:  The current medical regimen is effective;  continue present plan and medications.  *If you need a refill on your cardiac medications before your next appointment, please call your pharmacy*  Follow-Up: At [facility], you and your health needs are our priority.  As part of our continuing mission to provide you with exceptional heart care, we have created designated Provider Care Teams.  These Care Teams include your primary Cardiologist (physician) and Advanced Practice Providers (APPs -  Physician Assistants and Nurse Practitioners) who all work together to provide you with the care you need, when you need it.  We recommend signing up for the patient portal called "MyChart".  Sign up information is provided on this After Visit Summary.  MyChart is used to connect with patients for Virtual Visits (Telemedicine).  Patients are able to view lab/test results, encounter notes, upcoming appointments, etc.  Non-urgent messages can be sent to your provider as well.   To learn more about what you can do with MyChart, go to [email or web address].    Your next appointment:   1 year(s)  Provider:   Tessa Conte, PA-C, Ernest Dick, NP, Michele Lenze, PA-C, Michelle Swinyer, NP, or Scott Weaver, PA-C          

## 2023-04-14 NOTE — Progress Notes (Signed)
Cardiology Office Note:    Date:  04/14/2023   ID:  Brian Rollins, DOB [birth year], MRN [id number]  PCP:  Daisy Floro, MD   [facility] Providers Cardiologist:  Lesleigh Noe, MD (Inactive)     Referring MD: Daisy Floro, MD    History of Present Illness:    Brian Rollins is a 67 y.o. male history of coronary disease with stent to the mid LAD in July 2019 with hypertension hyperlipidemia obstructive sleep apnea diabetes.  Also has a history of tachycardia.  Has had knee surgery in the past.  Denies any fevers chills nausea vomiting.,  No chest pain.  No shortness of breath no dizziness.  No swelling.  Past Medical History:  Diagnosis Date   Chest discomfort    Chest pain on exertion    GERD (gastroesophageal reflux disease)    High cholesterol    Hydrocele, left    Hypertension    Keratoconus    OSA (obstructive sleep apnea)    "mild; don't wear mask" (06/27/2018)   Pneumonia 1980s X 1   Rapid heart beat    Type II diabetes mellitus (HCC)     Past Surgical History:  Procedure Laterality Date   CERVICAL DISC ARTHROPLASTY N/A 01/01/2019   Procedure: Cervical Four-Five Artificial disc replacement;  Surgeon: Barnett Abu, MD;  Location: [facility] OR;  Service: Neurosurgery;  Laterality: N/A;  anterior   CORONARY ANGIOPLASTY WITH STENT PLACEMENT  06/27/2018   CORONARY STENT INTERVENTION N/A 06/27/2018   Procedure: CORONARY STENT INTERVENTION;  Surgeon: Lyn Records, MD;  Location: [facility] INVASIVE CV LAB;  Service: Cardiovascular;  Laterality: N/A;   HYDROCELE EXCISION  12/03/2011   Procedure: HYDROCELECTOMY ADULT;  Surgeon: Garnett Farm, MD;  Location: [facility];  Service: Urology;  Laterality: Left;   KNEE ARTHROSCOPY Bilateral 2000s X 2   "removed scar tissue on the right; repaired torn meniscus"   KNEE ARTHROSCOPY W/ ACL RECONSTRUCTION Right 1990s   RIGHT/LEFT HEART CATH AND CORONARY ANGIOGRAPHY N/A 06/27/2018   Procedure: RIGHT/LEFT  HEART CATH AND CORONARY ANGIOGRAPHY;  Surgeon: Lyn Records, MD;  Location: [facility] INVASIVE CV LAB;  Service: Cardiovascular;  Laterality: N/A;    Current Medications: Current Meds  Medication Sig   aspirin EC 81 MG tablet Take 1 tablet (81 mg total) by mouth daily. Swallow whole.   benazepril (LOTENSIN) 20 MG tablet Take 20 mg by mouth daily at 6 PM. Takes to protect kidneys   dapagliflozin propanediol (FARXIGA) 10 MG TABS tablet Take 10 mg by mouth daily.   icosapent Ethyl (VASCEPA) 1 g capsule Take 2 capsules (2 g total) by mouth 2 (two) times daily.   Lancets (ONETOUCH DELICA PLUS LANCET33G) MISC Apply 1 each topically as directed.   metoprolol succinate (TOPROL-XL) 100 MG 24 hr tablet TAKE ONE TABLET BY MOUTH DAILY WITH OR IMMEDIATELY FOLLOWING A MEAL   nitroGLYCERIN (NITROSTAT) 0.4 MG SL tablet Place 1 tablet (0.4 mg total) under the tongue every 5 (five) minutes as needed for chest pain.   ONETOUCH ULTRA test strip as directed.   pantoprazole (PROTONIX) 40 MG tablet Take 40 mg by mouth daily.   pioglitazone (ACTOS) 45 MG tablet Take 45 mg by mouth daily.   RYBELSUS 7 MG TABS Take 1 tablet by mouth daily.   simvastatin (ZOCOR) 40 MG tablet Take 40 mg by mouth daily at 6 PM.    SitaGLIPtin-MetFORMIN HCl 50-1000 MG TB24 Take 2 tablets by mouth daily.  tamsulosin (FLOMAX) 0.4 MG CAPS capsule Take 0.8 mg by mouth daily at 6 PM.    [DISCONTINUED] glimepiride (AMARYL) 4 MG tablet Take 4 mg by mouth daily.     Allergies:   Mercury   Social History   Socioeconomic History   Marital status: Married    Spouse name: Brian Rollins   Number of children: 2   Years of education: college   Highest education level: Not on file  Occupational History   Occupation: [employer]  Tobacco Use   Smoking status: Former    Packs/day: 0.50    Years: 3.00    Additional pack years: 0.00    Total pack years: 1.50    Types: Cigarettes   Smokeless tobacco: Former   Tobacco comments:    06/27/2018 "quit  smoking & using chew  in the early 1980s"  Vaping Use   Vaping Use: Never used  Substance and Sexual Activity   Alcohol use: Yes    Alcohol/week: 0.0 standard drinks of alcohol    Comment: 06/27/2018 "might drink once/month"   Drug use: Never   Sexual activity: Not Currently  Other Topics Concern   Not on file  Social History Narrative   Lives with wife   Social Determinants of Health   Financial Resource Strain: Not on file  Food Insecurity: Not on file  Transportation Needs: Not on file  Physical Activity: Not on file  Stress: Not on file  Social Connections: Not on file     Family History: The patient's He was adopted. Family history is unknown by patient.  ROS:   Please see the history of present illness.     All other systems reviewed and are negative.  EKGs/Labs/Other Studies Reviewed:     The following studies were reviewed today: Diagnostic 2019 Dominance: Right Intervention      Doppler echocardiogram July 2021 IMPRESSIONS   1. Definity used; normal LV systolic function; grade 1 diastolic  dysfunction; mild LVH: negative saline microcavitation study.   2. Left ventricular ejection fraction, by estimation, is 60 to 65%. The  left ventricle has normal function. The left ventricle has no regional  wall motion abnormalities. There is mild left ventricular hypertrophy.  Left ventricular diastolic parameters  are consistent with Grade I diastolic dysfunction (impaired relaxation).  Elevated left atrial pressure.   3. Right ventricular systolic function is normal. The right ventricular  size is normal.   4. The mitral valve is normal in structure. No evidence of mitral valve  regurgitation. No evidence of mitral stenosis.   5. The aortic valve is tricuspid. Aortic valve regurgitation is not  visualized. No aortic stenosis is present.     EKG: 04/14/2023-normal sinus rhythm 88 right bundle branch block  Recent Labs: No results found for requested labs  within last 365 days.  Recent Lipid Panel    Component Value Date/Time   CHOL 111 08/22/2019 0742   TRIG 309 (H) 08/22/2019 0742   HDL 26 (L) 08/22/2019 0742   CHOLHDL 4.3 08/22/2019 0742   LDLCALC 38 08/22/2019 0742                Physical Exam:    VS:  BP 130/70   Pulse 88   Ht 5\' 5"  (1.651 m)   Wt 191 lb 12.8 oz (87 kg)   SpO2 96%   BMI 31.92 kg/m     Wt Readings from Last 3 Encounters:  04/14/23 191 lb 12.8 oz (87 kg)  02/19/22  192 lb 3.2 oz (87.2 kg)  07/07/20 196 lb 6.4 oz (89.1 kg)     GEN:  Well nourished, well developed in no acute distress HEENT: Normal NECK: No JVD; No carotid bruits LYMPHATICS: No lymphadenopathy CARDIAC: RRR, no murmurs, rubs, gallops RESPIRATORY:  Clear to auscultation without rales, wheezing or rhonchi  ABDOMEN: Soft, non-tender, non-distended MUSCULOSKELETAL:  No edema; No deformity  SKIN: Warm and dry NEUROLOGIC:  Alert and oriented x 3 PSYCHIATRIC:  Normal affect   ASSESSMENT:    1. Coronary artery disease involving native coronary artery of native heart with angina pectoris (HCC)   2. Essential hypertension   3. Mixed hyperlipidemia   4. TIA (transient ischemic attack)    PLAN:    In order of problems listed above:  Coronary artery disease PCI to LAD 2019 - Currently on low-dose aspirin 81 mg, simvastatin 40 mg a day high intensity.  Vascepa as well.  LDL 59 triglycerides 200 in September 2023.  No changes made in medication management  Diabetes with hypertension hyperlipidemia - Hemoglobin A1c 8.0 in March or 2024.  Multidrug regimen including SGLT2 inhibitor.  Per primary team.  Newstart Rybelsus.  Prior TIA - Aggressive secondary risk factor prevention.  Continue with exercise.  He does work from home he goes up and down his stairs he states.  Works in the garden.  Try to avoid high carbohydrates given his diabetes.  More fruits and vegetables.      1 yr with APP    Medication Adjustments/Labs and Tests  Ordered: Current medicines are reviewed at length with the patient today.  Concerns regarding medicines are outlined above.  Orders Placed This Encounter  Procedures   EKG 12-Lead   No orders of the defined types were placed in this encounter.   Patient Instructions  Medication Instructions:  The current medical regimen is effective;  continue present plan and medications.  *If you need a refill on your cardiac medications before your next appointment, please call your pharmacy*  Follow-Up: At [facility], you and your health needs are our priority.  As part of our continuing mission to provide you with exceptional heart care, we have created designated Provider Care Teams.  These Care Teams include your primary Cardiologist (physician) and Advanced Practice Providers (APPs -  Physician Assistants and Nurse Practitioners) who all work together to provide you with the care you need, when you need it.  We recommend signing up for the patient portal called "MyChart".  Sign up information is provided on this After Visit Summary.  MyChart is used to connect with patients for Virtual Visits (Telemedicine).  Patients are able to view lab/test results, encounter notes, upcoming appointments, etc.  Non-urgent messages can be sent to your provider as well.   To learn more about what you can do with MyChart, go to [email or web address].    Your next appointment:   1 year(s)  Provider:   Jari Favre, PA-C, Robin Searing, NP, Jacolyn Reedy, PA-C, Eligha Bridegroom, NP, or Tereso Newcomer, PA-C            Signed, Donato Schultz, MD  04/14/2023 9:44 AM    [facility]

## 2023-05-13 ENCOUNTER — Other Ambulatory Visit (HOSPITAL_COMMUNITY): Payer: Self-pay

## 2023-06-08 ENCOUNTER — Other Ambulatory Visit (HOSPITAL_COMMUNITY): Payer: Self-pay

## 2023-06-09 ENCOUNTER — Other Ambulatory Visit (HOSPITAL_COMMUNITY): Payer: Self-pay

## 2023-06-09 ENCOUNTER — Telehealth: Payer: Self-pay

## 2023-06-09 NOTE — Telephone Encounter (Addendum)
Pharmacy Patient Advocate Encounter   Received notification from [employer] that prior authorization for VASCEPA is needed.    PA submitted on 06/09/23 Key: [id number] Status is pending  Haze Rushing, CPhT Pharmacy Patient Advocate Specialist Fax: [phone]

## 2023-06-09 NOTE — Telephone Encounter (Signed)
Pharmacy Patient Advocate Encounter  Prior Authorization for VASCEPA has been approved.    Effective dates: 05/10/23 through 06/08/24  Haze Rushing, CPhT Pharmacy Patient Advocate Specialist Fax: [phone]

## 2023-08-31 ENCOUNTER — Telehealth: Payer: Self-pay | Admitting: Cardiology

## 2023-08-31 MED ORDER — NITROGLYCERIN 0.4 MG SL SUBL: 0.4000 mg | SUBLINGUAL_TABLET | SUBLINGUAL | 7 refills | Status: AC | PRN

## 2023-08-31 NOTE — Telephone Encounter (Signed)
*  STAT* If patient is at the pharmacy, call can be transferred to refill team.   1. Which medications need to be refilled? (please list name of each medication and dose if known) nitroGLYCERIN (NITROSTAT) 0.4 MG SL tablet   2. Which pharmacy/location (including street and city if local pharmacy) is medication to be sent to? [facility] [id number] - [place], [place] - [address]   3. Do they need a 30 day or 90 day supply? 90

## 2023-08-31 NOTE — Telephone Encounter (Signed)
Pt's medication was sent to pt's pharmacy as requested. Confirmation received.  °

## 2023-12-10 ENCOUNTER — Other Ambulatory Visit: Payer: Self-pay | Admitting: Physician Assistant

## 2024-03-03 ENCOUNTER — Other Ambulatory Visit: Payer: Self-pay | Admitting: Physician Assistant

## 2024-04-26 ENCOUNTER — Ambulatory Visit: Admitting: Nurse Practitioner

## 2024-05-06 NOTE — Progress Notes (Unsigned)
 Cardiology Office Note    Patient Name: Brian Rollins Date of Encounter: 05/06/2024  Primary Care Provider:  Jimmey Mould, MD Primary Cardiologist:  Mickiel Albany, MD (Inactive) Primary Electrophysiologist: None   Past Medical History    Past Medical History:  Diagnosis Date   Chest discomfort    Chest pain on exertion    GERD (gastroesophageal reflux disease)    High cholesterol    Hydrocele, left    Hypertension    Keratoconus    OSA (obstructive sleep apnea)    "mild; don't wear mask" (06/27/2018)   Pneumonia 1980s X 1   Rapid heart beat    Type II diabetes mellitus (HCC)     History of Present Illness  Brian Rollins is a 68 y.o. male with a PMH of CAD s/p PCI to mid LAD 05/2018, HTN, HLD, DM type II, RBBB OSA ( not tolerated),TIA 03/2020 and presents today for annual follow-up.  Brian Rollins was seen initially in 2016 for complaint of chest pain described as pressure and occasional nausea.  He also endorsed palpitations underwent a stress echo that was normal.  He was seen in 05/2018 with a complaint of exertional chest pain.  He underwent R/LHC that showed 99% stenosed mid LAD bifurcating lesion treated with DES/PCI x 1.  He was seen in follow-up and continued to do well with no recurrence of chest pain.  Had a 2D echo completed in 05/2020 showing normal EF of 60 to 65% with grade 1 DD and no significant valve abnormalities.  He completed a carotid ultrasound due to TIA in 03/2020 that showed minimal thickening bilaterally.  He was seen initially by Brian Rollins on 04/14/2023 doing well with no changes to medication regimen.  Brian Rollins presents today for his annual follow-up.  He reports since his previous follow-up, no current chest pain, exertional symptoms, or significant daytime sleepiness. He has type 2 diabetes with an A1c of 7.3. He is on Ozempic, which was increased from 1 to 2 mg, and also takes Jardiance and Farxiga. He monitors his blood sugars and notes improvement,  with fewer high readings. He has lost weight since his stent placement, now weighing 179 pounds, down from over 200 pounds. He is active, though not following a structured exercise program. He has a history of a transient ischemic attack (TIA) in May 2021, with a carotid ultrasound showing thickening but no blockages. He reports occasional numbness in his hands, which he attributes to a disc replacement and the use of a chair with high armrests. He experienced visual disturbances during the TIA, described as 'a spot where I couldn't see you. He has a history of sleep apnea but discontinued CPAP use after six months due to intolerance. He reports occasional daytime sleepiness but no significant symptoms of sleep apnea currently. He mentions a history of restless legs and frequent nighttime urination, which he associates with his diabetes. He reports a history of increased heart rate during a stress test, which remained elevated for five minutes post-exercise. He is on metoprolol  100 mg for heart rate control. Patient denies chest pain, palpitations, dyspnea, PND, orthopnea, nausea, vomiting, dizziness, syncope, edema, weight gain, or early satiety.  Discussed the use of AI scribe software for clinical note transcription with the patient, who gave verbal consent to proceed.  History of Present Illness   Review of Systems  Please see the history of present illness.    All other systems reviewed and are otherwise negative except as  noted above.  Physical Exam     Wt Readings from Last 3 Encounters:  04/14/23 191 lb 12.8 oz (87 kg)  02/19/22 192 lb 3.2 oz (87.2 kg)  07/07/20 196 lb 6.4 oz (89.1 kg)   ON:GEXBM were no vitals filed for this visit.,There is no height or weight on file to calculate BMI. GEN: Well nourished, well developed in no acute distress Neck: No JVD; No carotid bruits Pulmonary: Clear to auscultation without rales, wheezing or rhonchi  Cardiovascular: Normal rate. Regular  rhythm. Normal S1. Normal S2.   Murmurs: There is no murmur.  ABDOMEN: Soft, non-tender, non-distended EXTREMITIES:  No edema; No deformity   EKG/LABS/ Recent Cardiac Studies   ECG personally reviewed by me today -sinus rhythm with RBBB and rate of 90 bpm with no acute changes consistent with previous EKG.  Risk Assessment/Calculations:          Lab Results  Component Value Date   WBC 5.4 08/22/2019   HGB 14.7 08/22/2019   HCT 43.6 08/22/2019   MCV 90 08/22/2019   PLT 222 08/22/2019   Lab Results  Component Value Date   CREATININE 0.99 08/22/2019   BUN 20 08/22/2019   NA 139 08/22/2019   K 4.1 08/22/2019   CL 103 08/22/2019   CO2 22 08/22/2019   Lab Results  Component Value Date   CHOL 111 08/22/2019   HDL 26 (L) 08/22/2019   LDLCALC 38 08/22/2019   TRIG 309 (H) 08/22/2019   CHOLHDL 4.3 08/22/2019    Lab Results  Component Value Date   HGBA1C 8.9 (H) 12/25/2018   Assessment & Plan    Assessment & Plan  1.  History of CAD: Coronary artery disease with a stent in the left anterior descending artery since 2019. No recurrence of symptoms and normal heart function on the last echocardiogram in 2021. - Continue current GDMT with ASA 81 mg, Toprol -XL 100 mg, as needed Nitrostat  0.4 mg, Zocor  40 mg daily  2.  Essential hypertension: - Patient blood pressure today was stable at 116/64 - Continue Lotensin  20 mg, Toprol -XL 100 mg  3.  Hyperlipidemia: - Patient's LDL cholesterol was 37 on 8/41/3244 - Continue Zocor  40 mg daily  4.  DM type II: -Type 2 diabetes mellitus with A1c of 7.3, indicating suboptimal control. Reports improvement in blood sugar levels. Discussed diet and exercise importance. - Continue Ozempic, Jardiance, and Farxiga. - Encourage dietary management and regular physical activity. - Monitor blood sugar levels regularly.  5.  History of TIA History of TIA with no recent episodes. Occasional hand numbness possibly related to previous disc  replacement. Discussed monitoring for new symptoms and regular ophthalmologic evaluations. - Ensure regular follow-up with ophthalmologist for diabetic eye exams.  6. Obstructive Sleep Apnea: Obstructive sleep apnea with previous CPAP intolerance. Discussed alternative options like the Inspire device. - Consider referral to ENT for [facility] device evaluation.   Disposition: Follow-up with Mickiel Albany, MD (Inactive) or APP in 12 months    Signed, Francene Ing, Retha Cast, NP 05/06/2024, 2:18 PM [facility]

## 2024-05-07 ENCOUNTER — Ambulatory Visit: Attending: Nurse Practitioner | Admitting: Nurse Practitioner

## 2024-05-07 ENCOUNTER — Encounter: Payer: Self-pay | Admitting: Nurse Practitioner

## 2024-05-07 VITALS — BP 116/64 | HR 90 | Ht 65.0 in | Wt 179.0 lb

## 2024-05-07 DIAGNOSIS — I25119 Atherosclerotic heart disease of native coronary artery with unspecified angina pectoris: Secondary | ICD-10-CM | POA: Diagnosis not present

## 2024-05-07 DIAGNOSIS — G459 Transient cerebral ischemic attack, unspecified: Secondary | ICD-10-CM | POA: Diagnosis not present

## 2024-05-07 DIAGNOSIS — E782 Mixed hyperlipidemia: Secondary | ICD-10-CM | POA: Diagnosis not present

## 2024-05-07 DIAGNOSIS — I10 Essential (primary) hypertension: Secondary | ICD-10-CM | POA: Diagnosis not present

## 2024-05-07 DIAGNOSIS — E1100 Type 2 diabetes mellitus with hyperosmolarity without nonketotic hyperglycemic-hyperosmolar coma (NKHHC): Secondary | ICD-10-CM

## 2024-05-07 NOTE — Patient Instructions (Signed)
 Medication Instructions:  Your physician recommends that you continue on your current medications as directed. Please refer to the Current Medication list given to you today. *If you need a refill on your cardiac medications before your next appointment, please call your pharmacy*  Lab Work: None Ordered If you have labs (blood work) drawn today and your tests are completely normal, you will receive your results only by: MyChart Message (if you have MyChart) OR A paper copy in the mail If you have any lab test that is abnormal or we need to change your treatment, we will call you to review the results.  Testing/Procedures: None ordered   Follow-Up: At [facility], you and your health needs are our priority.  As part of our continuing mission to provide you with exceptional heart care, our providers are all part of one team.  This team includes your primary Cardiologist (physician) and Advanced Practice Providers or APPs (Physician Assistants and Nurse Practitioners) who all work together to provide you with the care you need, when you need it.  Your next appointment:   12 month(s)  Provider:   Dorothye Gathers, MD    We recommend signing up for the patient portal called "MyChart".  Sign up information is provided on this After Visit Summary.  MyChart is used to connect with patients for Virtual Visits (Telemedicine).  Patients are able to view lab/test results, encounter notes, upcoming appointments, etc.  Non-urgent messages can be sent to your provider as well.   To learn more about what you can do with MyChart, go to [email or web address].   Other Instructions

## 2024-08-01 ENCOUNTER — Telehealth: Payer: Self-pay | Admitting: Pharmacy Technician

## 2024-08-01 ENCOUNTER — Other Ambulatory Visit (HOSPITAL_COMMUNITY): Payer: Self-pay

## 2024-08-01 NOTE — Telephone Encounter (Signed)
      PROMTPA in progress

## 2024-08-03 ENCOUNTER — Other Ambulatory Visit (HOSPITAL_COMMUNITY): Payer: Self-pay

## 2024-08-03 NOTE — Telephone Encounter (Signed)
 Pharmacy Patient Advocate Encounter  Received notification from [employer] that Prior Authorization for icosapent  has been APPROVED from 08/03/24 to 08/02/25
